# Patient Record
Sex: Male | Born: 1985 | Race: White | Hispanic: No | Marital: Single | Smoking: Current every day smoker
Health system: Southern US, Community
[De-identification: ages and names within clinical notes are randomized; demographics above are authoritative.]

## PROBLEM LIST (undated history)

## (undated) DIAGNOSIS — L0291 Cutaneous abscess, unspecified: Secondary | ICD-10-CM

---
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
## 2006-12-23 ENCOUNTER — Emergency Department: Payer: Self-pay | Admitting: Emergency Medicine

## 2007-03-29 ENCOUNTER — Emergency Department: Payer: Self-pay | Admitting: Emergency Medicine

## 2007-10-27 ENCOUNTER — Emergency Department: Payer: Self-pay | Admitting: Emergency Medicine

## 2008-03-12 ENCOUNTER — Emergency Department: Payer: Self-pay | Admitting: Emergency Medicine

## 2008-03-16 ENCOUNTER — Emergency Department: Payer: Self-pay | Admitting: Emergency Medicine

## 2008-03-18 ENCOUNTER — Emergency Department (HOSPITAL_COMMUNITY): Admission: EM | Admit: 2008-03-18 | Discharge: 2008-03-18 | Payer: Self-pay | Admitting: Emergency Medicine

## 2008-05-16 ENCOUNTER — Emergency Department (HOSPITAL_COMMUNITY): Admission: EM | Admit: 2008-05-16 | Discharge: 2008-05-16 | Payer: Self-pay | Admitting: Emergency Medicine

## 2008-06-08 ENCOUNTER — Emergency Department: Payer: Self-pay | Admitting: Emergency Medicine

## 2008-06-23 ENCOUNTER — Emergency Department (HOSPITAL_COMMUNITY): Admission: EM | Admit: 2008-06-23 | Discharge: 2008-06-24 | Payer: Self-pay | Admitting: Emergency Medicine

## 2008-10-09 ENCOUNTER — Emergency Department (HOSPITAL_COMMUNITY): Admission: EM | Admit: 2008-10-09 | Discharge: 2008-10-09 | Payer: Self-pay | Admitting: Emergency Medicine

## 2008-12-30 ENCOUNTER — Emergency Department (HOSPITAL_COMMUNITY): Admission: EM | Admit: 2008-12-30 | Discharge: 2008-12-30 | Payer: Self-pay | Admitting: Emergency Medicine

## 2009-08-29 ENCOUNTER — Emergency Department: Payer: Self-pay

## 2010-04-23 ENCOUNTER — Emergency Department (HOSPITAL_COMMUNITY): Admission: EM | Admit: 2010-04-23 | Discharge: 2010-04-24 | Payer: Self-pay | Admitting: Emergency Medicine

## 2010-04-26 ENCOUNTER — Emergency Department: Payer: Self-pay | Admitting: Emergency Medicine

## 2010-05-08 ENCOUNTER — Emergency Department (HOSPITAL_COMMUNITY): Admission: EM | Admit: 2010-05-08 | Discharge: 2010-05-08 | Payer: Self-pay | Admitting: Emergency Medicine

## 2010-07-26 ENCOUNTER — Emergency Department (HOSPITAL_COMMUNITY): Admission: EM | Admit: 2010-07-26 | Discharge: 2010-07-26 | Payer: Self-pay | Admitting: Emergency Medicine

## 2010-09-21 ENCOUNTER — Emergency Department (HOSPITAL_COMMUNITY): Admission: EM | Admit: 2010-09-21 | Discharge: 2010-05-28 | Payer: Self-pay | Admitting: Emergency Medicine

## 2010-09-25 ENCOUNTER — Emergency Department (HOSPITAL_COMMUNITY)
Admission: EM | Admit: 2010-09-25 | Discharge: 2010-09-25 | Payer: Self-pay | Source: Home / Self Care | Admitting: Emergency Medicine

## 2010-11-01 ENCOUNTER — Emergency Department: Payer: Self-pay | Admitting: Emergency Medicine

## 2010-11-02 ENCOUNTER — Emergency Department (HOSPITAL_COMMUNITY)
Admission: EM | Admit: 2010-11-02 | Discharge: 2010-11-02 | Payer: Self-pay | Source: Home / Self Care | Admitting: Emergency Medicine

## 2012-04-11 ENCOUNTER — Emergency Department (HOSPITAL_COMMUNITY)
Admission: EM | Admit: 2012-04-11 | Discharge: 2012-04-11 | Disposition: A | Discharge status: Home / Self Care | Payer: Self-pay | Attending: Emergency Medicine | Admitting: Emergency Medicine

## 2012-04-11 ENCOUNTER — Encounter (HOSPITAL_COMMUNITY): Payer: Self-pay | Admitting: *Deleted

## 2012-04-11 DIAGNOSIS — X503XXA Overexertion from repetitive movements, initial encounter: Secondary | ICD-10-CM | POA: Insufficient documentation

## 2012-04-11 DIAGNOSIS — K089 Disorder of teeth and supporting structures, unspecified: Secondary | ICD-10-CM | POA: Insufficient documentation

## 2012-04-11 DIAGNOSIS — M25569 Pain in unspecified knee: Secondary | ICD-10-CM | POA: Insufficient documentation

## 2012-04-11 DIAGNOSIS — M222X1 Patellofemoral disorders, right knee: Secondary | ICD-10-CM

## 2012-04-11 DIAGNOSIS — S025XXA Fracture of tooth (traumatic), initial encounter for closed fracture: Secondary | ICD-10-CM | POA: Insufficient documentation

## 2012-04-11 DIAGNOSIS — K0889 Other specified disorders of teeth and supporting structures: Secondary | ICD-10-CM

## 2012-04-11 DIAGNOSIS — K047 Periapical abscess without sinus: Secondary | ICD-10-CM | POA: Insufficient documentation

## 2012-04-11 MED ORDER — PENICILLIN V POTASSIUM 500 MG PO TABS: 1000.0000 mg | ORAL_TABLET | Freq: Two times a day (BID) | ORAL | Status: AC

## 2012-04-11 MED ORDER — OXYCODONE-ACETAMINOPHEN 5-325 MG PO TABS: 2.0000 | ORAL_TABLET | Freq: Three times a day (TID) | ORAL | Status: AC | PRN

## 2012-04-11 NOTE — ED Provider Notes (Signed)
History   This chart was scribed for No att. providers found by Toya Smothers. The patient was seen in room TR05C/TR05C. Patient's care was started at 1001.  CSN: [id number]  Arrival date & time 04/11/12  1001   First MD Initiated Contact with Patient 04/11/12 1115      Chief Complaint  Patient presents with  . Knee Pain    HPI Alex [name] is a 26 y.o. male who presents to the Emergency Department complaining of Chronic R knee pain localized to the patella tendon without radiation or associated weakness or numbness. Tooth number 29 fractured secondary to minor local trama. With scant puss drainage at time of injury with to filling. Pt denies HA or LOC at time of injury. No TMG no jaw malocclusion. No focal weakness or obscure coordination. Described both knee and dental pain as severe localized, with no associated symptoms.  History reviewed. No pertinent past medical history.  History reviewed. No pertinent past surgical history.  No family history on file.  History  Substance Use Topics  . Smoking status: Current Everyday Smoker    Types: Cigarettes  . Smokeless tobacco: Not on file  . Alcohol Use: No   Review of Systems  Constitutional: Negative for fever and chills.  HENT: Positive for dental problem (Fractured.). Negative for congestion, rhinorrhea and neck pain.   Eyes: Negative for pain.  Respiratory: Negative for cough and shortness of breath.   Cardiovascular: Negative for chest pain and leg swelling.  Gastrointestinal: Negative for nausea, vomiting, abdominal pain, diarrhea, constipation and blood in stool.  Genitourinary: Negative for dysuria.  Musculoskeletal: Positive for arthralgias (R knee). Negative for back pain and gait problem.  Skin: Negative for rash.  Neurological: Negative for dizziness, syncope, weakness, numbness and headaches.  All other systems reviewed and are negative.    Allergies  Doxycycline and Hydrocodone  Home Medications    Current Outpatient Rx  Name Route Sig Dispense Refill  . IBUPROFEN 800 MG PO TABS Oral Take 800 mg by mouth 2 (two) times daily as needed. For pain    . OXYCODONE-ACETAMINOPHEN 5-325 MG PO TABS Oral Take 2 tablets by mouth every 8 (eight) hours as needed for pain. 20 tablet 0  . PENICILLIN V POTASSIUM 500 MG PO TABS Oral Take 2 tablets (1,000 mg total) by mouth 2 (two) times daily. X 7 days 28 tablet 0    BP 121/89  Pulse 81  Temp 98 F (36.7 C) (Oral)  Resp 17  SpO2 98%  Physical Exam  Nursing note and vitals reviewed. Constitutional: He is oriented to person, place, and time. He appears well-developed and well-nourished. No distress.  HENT:  Head: Normocephalic and atraumatic.  Mouth/Throat:    Eyes: EOM are normal. Pupils are equal, round, and reactive to light.  Neck: Neck supple. No tracheal deviation present.  Cardiovascular: Normal rate and regular rhythm.   Pulmonary/Chest: Effort normal and breath sounds normal. No respiratory distress.  Abdominal: Soft. He exhibits no distension. There is no tenderness.  Musculoskeletal: Normal range of motion. He exhibits no edema.       Pt pulse intact L ankle. Normal touch R knee. Non-tender R thigh. Negative patella apprehension. tenderness localized R patella pendent . Papitatilla faucet non tender. No laxity or pain with lock test. Full active extension of R knee.  Lymphadenopathy:    He has no cervical adenopathy.  Neurological: He is alert and oriented to person, place, and time. He has normal reflexes. No cranial  nerve deficit or sensory deficit. Coordination normal.  Skin: Skin is warm and dry. No rash noted. No erythema. No pallor.  Psychiatric: He has a normal mood and affect. His behavior is normal.    ED Course  Procedures (including critical care time) DIAGNOSTIC STUDIES: Oxygen Saturation is 98% on room air, normal by my interpretation.    COORDINATION OF CARE: 1124- Evaluated Pt. Will treat with pain  medication. Advised not to take Ibuprofen and Aleve at the same time.   Labs Reviewed - No data to display No results found.   1. Patellofemoral syndrome, right   2. Pain, dental   3. Fractured tooth   4. Dental abscess       MDM  Pt stable in ED with no significant deterioration in condition.Patient / Family / Caregiver informed of clinical course, understand medical decision-making process, and agree with plan.     I personally performed the services described in this documentation, which was scribed in my presence. The recorded information has been reviewed and considered.    Hurman Horn, MD 04/20/12 [phone]

## 2012-04-11 NOTE — ED Notes (Signed)
Pt. Stated, I'm having rt. Knee pain and tooth ache and the knee hurts real bad.  Pain started month ago. Toothache, started "Tuesday

## 2012-04-11 NOTE — Discharge Instructions (Signed)
You have been seen by your caregiver because of dental pain.  SEEK MEDICAL ATTENTION IF: The exam and treatment you received today has been provided on an emergency basis only. This is not a substitute for complete medical or dental care. If your problem worsens or new symptoms (problems) appear, and you are unable to arrange prompt follow-up care with your dentist, call or return to this location. CALL YOUR DENTIST OR RETURN IMMEDIATELY IF you develop a fever, rash, difficulty breathing or swallowing, neck or facial swelling, or other potentially serious concerns. You have had a head injury which does not appear to require admission at this time. A concussion is a state of changed mental ability from trauma. SEEK IMMEDIATE MEDICAL ATTENTION IF: There is confusion or drowsiness (although children frequently become drowsy after injury).  You cannot awaken the injured person.  There is nausea (feeling sick to your stomach) or continued, forceful vomiting.  You notice dizziness or unsteadiness which is getting worse, or inability to walk.  You have convulsions or unconsciousness.  You experience severe, persistent headaches not relieved by Tylenol?. (Do not take aspirin as this impairs clotting abilities). Take other pain medications only as directed.  You cannot use arms or legs normally.  There are changes in pupil sizes. (This is the black center in the colored part of the eye)  There is clear or bloody discharge from the nose or ears.  Change in speech, vision, swallowing, or understanding.  Localized weakness, numbness, tingling, or change in bowel or bladder control.

## 2012-04-11 NOTE — ED Notes (Signed)
Patient to [facility] ED with C/O right knee pain. This has been ongoing for 2 months. States that he does a lot of stooping and standing at work.  Knee brace is in place.

## 2012-08-17 ENCOUNTER — Emergency Department: Payer: Self-pay | Admitting: Emergency Medicine

## 2013-06-17 ENCOUNTER — Emergency Department: Payer: Self-pay | Admitting: Emergency Medicine

## 2013-06-17 LAB — CBC
HCT: 46.7 % (ref 40.0–52.0)
HGB: 16.1 g/dL (ref 13.0–18.0)
MCHC: 34.5 g/dL (ref 32.0–36.0)
WBC: 9.9 10*3/uL (ref 3.8–10.6)

## 2013-06-17 LAB — URINALYSIS, COMPLETE
Bacteria: NONE SEEN
Ketone: NEGATIVE
Leukocyte Esterase: NEGATIVE
Squamous Epithelial: NONE SEEN
WBC UR: 4 /HPF (ref 0–5)

## 2013-06-17 LAB — COMPREHENSIVE METABOLIC PANEL
Anion Gap: 6 — ABNORMAL LOW (ref 7–16)
BUN: 27 mg/dL — ABNORMAL HIGH (ref 7–18)
Calcium, Total: 9.5 mg/dL (ref 8.5–10.1)
EGFR (African American): >60
Potassium: 4.3 mmol/L (ref 3.5–5.1)

## 2013-06-17 LAB — DRUG SCREEN, URINE
Amphetamines, Ur Screen: NEGATIVE (ref ?–1000)
Barbiturates, Ur Screen: NEGATIVE (ref ?–200)
Benzodiazepine, Ur Scrn: NEGATIVE (ref ?–200)
Cannabinoid 50 Ng, Ur ~~LOC~~: NEGATIVE (ref ?–50)
Opiate, Ur Screen: NEGATIVE (ref ?–300)
Phencyclidine (PCP) Ur S: NEGATIVE (ref ?–25)

## 2014-12-04 ENCOUNTER — Inpatient Hospital Stay: Payer: Self-pay | Admitting: Internal Medicine

## 2015-02-13 NOTE — Consult Note (Signed)
Chief Complaint:  Subjective/Chief Complaint Patient reports continued pain, but slightly improved.  mild drainage from I&D site.   Brief Assessment:  GEN well developed, well nourished   Additional Physical Exam Neck- improved fluctuance around submental area.  continued and expected tenderness and induration.  Skin- innumerable excoriations of upper and lower extremities and face   Assessment/Plan:  Assessment/Plan:  Assessment cellulitis and submental abcess s/p I&D   Plan 1)  Follow culture results but this was after Vancomycin and Cleocin so possibly only gram stain will be helpful 2)  Defer to medicine regarding discharge antibiotics 3)  Consider addition of Prednisone 60mg  oral taper for edema 4)  No further workup from ENT needed given drainage of only fluid site.   Electronic Signatures: Wilna Pennie, Rayfield Citizenreighton Charles (MD)  (Signed 19-Feb-16 17:46)  Authored: Chief Complaint, Brief Assessment, Assessment/Plan   Last Updated: 19-Feb-16 17:46 by Flossie DibbleVaught, Nicolette Gieske Charles (MD)

## 2015-02-13 NOTE — Discharge Summary (Signed)
PATIENT NAME:  Renato ShinMILLER, Tige D MR#:  [id number] DATE OF BIRTH:  10-29-1985  DATE OF ADMISSION:  12/04/2014  DATE OF DISCHARGE:  12/05/2014  DISCHARGE DIAGNOSES:  1.  Cellulitis of the face, MRSA in culture report. 2.  Cocaine abuse.  3.  Smoking.   CONDITION ON DISCHARGE: Stable.   MEDICATIONS ON DISCHARGE:  1.  Prednisone 10 mg oral tablet start 60 mg and taper x 10 mg daily until complete.  2.  Ibuprofen 600 mg oral tablet 2 times a day. 3.  Oxycodone 5 mg tablet every 6 hours as needed for moderate pain.  4.  Bactrim DS 800 mg +160 mg tablet 2 times a day for 12 days.   DIET ON DISCHARGE: Regular. Diet consistency: Regular.   ACTIVITY: As tolerated.   FOLLOW UP: Advised to follow with primary care physician in 2-4 weeks.   HISTORY OF PRESENT ILLNESS: The patient is a 29 year old male with a history of cocaine abuse presented to the Emergency Room with complaints of redness and draining skin lesion on the face and neck ongoing for the last 1 week. He develop a small boil over the face and further worsened as he tampered with that one. He had some fever, but no chills and gradually the swelling and discharge was getting bigger and bigger so he decided to come to the Emergency Room. There were numerous scratch marks and crusting skin lesions present on both his upper and lower extremities. He was admitted with a diagnosis of cellulitis and abscess.   HOSPITAL COURSE:  1.  Cellulitis and small abscess on the face. He was seen by ENT doctor and incision and drainage was done. This drainage was sent for culture report and admitted with broad-spectrum antibiotic vancomycin and Zosyn.  He had significant improvement in condition in the next 2 weeks and the culture reported to have MRSA in the report. I counseled him about good skin hygiene and discharged him with oral Bactrim to finish total of 2 weeks of therapy with antibiotics.   2.  Cocaine abuse. There was no symptoms of withdrawal. We  continued monitoring in the hospital.   3.  Smoking. Counseled about quitting smoking habit. He did not require any support but had episodes of walking out and smoking while he was admitted in the hospital.   CONSULTATION IN THE HOSPITAL:  ENT Dr. Bettey Costaharles Vaught.  IMPORTANT LABORATORY RESULTS IN THIS HOSPITAL: WBC count 11.3, hemoglobin was 14.7, platelet count 183,000, MCV 89 on admission. BUN was 13, creatinine 1.03, sodium 141, potassium 4.0, chloride 104, CO2 of 31 on admission. Blood culture on admission was negative. Ultrasound of the soft tissue of the head and neck shows subcutaneous abscess and ADL cellulitis, submental region. Wound culture grew heavy growth of MRSA.  On further follow-up WBC count came down to 8.5 on 12/04/2014 and hemoglobin stable at 14.1.   TOTAL TIME SPENT ON DISCHARGE: 40 minutes.    ____________________________ Hope PigeonVaibhavkumar G. Elisabeth PigeonVachhani, MD vgv:mc D: 12/08/2014 08:35:56 ET T: 12/08/2014 12:08:59 ET JOB#: [id number]  cc: Hope PigeonVaibhavkumar G. Elisabeth PigeonVachhani, MD, <Dictator> Altamese DillingVAIBHAVKUMAR Mavin Dyke MD ELECTRONICALLY SIGNED 12/27/2014 12:51

## 2015-02-13 NOTE — Consult Note (Signed)
PATIENT NAME:  Alex [name], Alex [name] MR#:  [id number] DATE OF BIRTH:  1986/08/19  DATE OF CONSULTATION:  12/03/2014  REFERRING PHYSICIAN:   CONSULTING PHYSICIAN:  Kyung Ruddreighton C. Romanita Fager, MD  REASON FOR CONSULTATION: Submental abscess.   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 He has had a history of facial abscess before in the past, who has presented to the Emergency Room and was found to have a submental abscess on ultrasound examination. Admitted, placed on vancomycin and clindamycin. Blood cultures were obtained. The patient also has a history of illicit drug use, as well as tobacco use. The patient reports he had this several years ago on his face and [facility]   PAST MEDICAL HISTORY: Significant for illicit drug use.   PAST SURGICAL HISTORY:  An incision and drainage of facial abscess.   ALLERGIES: HYDROCODONE, NAUSEA.   HOME MEDICATIONS: None.  FAMILY HISTORY: There is no significant history of diabetes or hypertension.   SOCIAL HISTORY: The patient is engaged. Works as a Curatormechanic. He has a history of cocaine use and tobacco use. Denies any IV drug use.   PHYSICAL EXAMINATION: VITAL SIGNS: Temperature is 99.1, pulse is 80 respirations 20, and blood pressure 122/84, pulse oximetry 99%.  GENERAL: He is a well-nourished, well-developed male in no acute distress.  EARS: EACs are clear.  NOSE: Clear anteriorly. Oral cavity and oropharynx reveals no masses or lesions. Floor of mouth is supple to palpation. NECK: Reveals diffuse erythema and tenderness in the submental and mental region, extending up to the angle of his mandible. There is some fluctuance to palpation in his submental region, as well as a fullness just more posterior to that.  SKIN: He has innumerable excoriations from self-inflicted injuries with "picking" at his skin, per patient.   PROCEDURE: Incision and drainage  was performed. Please see separate operative note for details.   IMAGING STUDIES: Ultrasound report is reviewed, which reveals a fluid collection of the submental region.   IMPRESSION: Submental abscess and cellulitis, status post incision and drainage.   PLAN: I will send a culture out for aerobic wound culture, but this was after the Cleocin and vancomycin had already been given. Consider addition of oral prednisone. I would anticipate resolution of the submental abscess once appropriate antibiotics are continued. I will defer to medicine for a decision as far as what that will be, based on the culture results. I will reevaluate the patient later on today and if continued improvement, [facility]     ____________________________ Kyung Ruddreighton C. Loel Betancur, MD ccv:mw [name]: 12/03/2014 17:50:10 ET T: 12/03/2014 18:42:02 ET JOB#: [id number]  cc: Kyung Ruddreighton C. Aidian Salomon, MD, <Dictator> Kyung RuddREIGHTON C Ciarah Peace MD ELECTRONICALLY SIGNED 01/05/2015 9:50

## 2015-02-13 NOTE — Op Note (Signed)
PATIENT NAME:  Alex [name], Alex [name] MR#:  [id number] DATE OF BIRTH:  August 21, 1986  DATE OF PROCEDURE:  12/03/2014  PREPROCEDURE DIAGNOSIS: Submental abscess.   POSTPROCEDURE DIAGNOSIS: Submental abscess.   PROCEDURE PERFORMED: Incision and drainage of submental abscess.   SURGEON: Local anesthesia.   ESTIMATED BLOOD LOSS: Less than 1 mL.  INTRAVENOUS FLUIDS: None.   SPECIMENS: Swab for aerobic culture.   COMPLICATIONS: None.   DRAINS, STENT PLACEMENTS: None.   INDICATIONS FOR PROCEDURE: The patient is a [age] year old male with a history of cellulitis, found to have submental abscess on ultrasound.   OPERATIVE FINDINGS: Purulent and necrotic drainage from the patient's submental abscess with successful incision and drainage.   DESCRIPTION OF PROCEDURE: The patient was identified in his bed. Verbal and written consent was obtained. The patient's neck was prepped and draped in a sterile fashion with iodine. A 25-gauge needle was in used for injection of the submental region of approximately 2.5 mL of 1% lidocaine with 1:100,000 epinephrine. This demonstrated a small drain pore. Using a hemostat, this pore was opened. Septations within the submental abscess were also opened and approximately 10 mL of purulent drainage was expressed from the submental region. The patient tolerated the procedure well and this was dressed with 4 x 4 gauze.    ____________________________ Kyung Ruddreighton C. Tynesha Free, MD ccv:bm [name]: 12/03/2014 17:52:29 ET T: 12/03/2014 23:57:13 ET JOB#: [id number]  cc: Kyung Ruddreighton C. Zacheriah Stumpe, MD, <Dictator> Kyung RuddREIGHTON C Dola Lunsford MD ELECTRONICALLY SIGNED 01/05/2015 9:50

## 2015-02-13 NOTE — Consult Note (Signed)
Brief Consult Note: Diagnosis: abcess of submental area.   Consult note dictated.   Comments: 29 y.o. male with submental fluid collection and pain.  Ultrasound of submental region demonstrated abcess.  PE- Gen- multiple sores over upper and lower extremities and face OC/OP- supple FOM with no induration Neck- induration and fluctuance in submental region, purulent drainage Ears- clear Nose- clear  I&D performed of submental abcess- fluid collection drained and swab sent for culture  Impression:  s/p I & D of abcess  Plan:  1)  culture directed antibiotics if any viable bacteria there 2)  continue dressings prn drainage 3)  Defer Abx to medicine.  Electronic Signatures: Sparkle Aube, Rayfield Citizenreighton Charles (MD)  (Signed 19-Feb-16 13:32)  Authored: Brief Consult Note   Last Updated: 19-Feb-16 13:32 by Flossie DibbleVaught, Romana Deaton Charles (MD)

## 2015-02-13 NOTE — H&P (Signed)
PATIENT NAME:  Alex [name], HARR MR#:  [id number] DATE OF BIRTH:  [birth year]  DATE OF ADMISSION:  12/03/2014  REFERRING PHYSICIAN:  Enedina Finner. Manson Passey, MD   PRIMARY CARE PRACTITIONER:  Nonlocal.   ADMITTING PHYSICIAN:  Crissie Figures, MD   CHIEF COMPLAINT:  Redness with discharging skin lesions from the face and neck of 1 week duration.   HISTORY OF PRESENT ILLNESS:  This is a 29 year old Caucasian male with a history of cocaine usage, who presents to the Emergency Room with the complaints of redness with draining skin lesions over the face and neck ongoing for the past 1 week. The patient states that he developed small boils over the face, which further worsened and progressed into discharging and draining wounds. Hence, he came to the Emergency Room for further evaluation. He admits to some fever, but no chills. No chest pain. No shortness of breath. No nausea, vomiting, or diarrhea. He does have some cough, but no sputum. No urinary symptoms.   In the Emergency Room, the patient was evaluated by the ED physician and was found to be with stable vitals and workup was unremarkable. A urine drug screen was positive for cocaine and opiates. The patient was diagnosed to have cellulitis of the face and anterior part of the neck, and the patient also underwent ultrasound examination of the face and the neck, which revealed a 1 cm subcutaneous abscess in the cellulitic region. General surgery on-call was consulted by the ED physician and general surgery recommended to contact ENT for further evaluation. Hospitalist service was consulted for further evaluation and management. The patient had blood cultures drawn and was started on IV antibiotics.   PAST MEDICAL HISTORY:  Cocaine usage. No history of any diabetes or hypertension.  PAST SURGICAL HISTORY:  No history of any surgeries in the past.   ALLERGIES:  HYDROCODONE.   HOME MEDICATIONS:  None.   FAMILY HISTORY:  No history of any hypertension or  diabetes.   SOCIAL HISTORY:  He is engaged. He works as a Curator on motorcycles. History of smoking 1 pack per day for the past many years. History of cocaine usage and states he snorts cocaine. Denies any IV drug usage. Denies any alcohol usage.    REVIEW OF SYSTEMS:  CONSTITUTIONAL:  Positive for fever, but no chills. No fatigue. No generalized weakness.  EYES:  Negative for blurred vision or double vision. No pain. No redness. No discharge.  EARS, NOSE, AND THROAT:  Negative for tinnitus, ear pain, hearing loss, epistaxis, nasal discharge, or difficulty swallowing.  RESPIRATORY:  Positive for some mild cough. No wheezing. No dyspnea. No hemoptysis. No painful respirations.  CARDIOVASCULAR:  Negative for chest pain, palpitations, dizziness, syncopal episodes, orthopnea, dyspnea on exertion, or pedal edema.  GASTROINTESTINAL:  Negative for nausea, vomiting, diarrhea, abdominal pain, hematemesis, melena, rectal bleeding, or GERD symptoms.  GENITOURINARY:  Negative for dysuria, frequency, urgency, or hematuria.  ENDOCRINE:  Negative for polyuria, nocturia, or heat or cold intolerance.  HEMATOLOGIC AND LYMPHATIC:  Negative for anemia, easy bruising or bleeding, or swollen glands.  INTEGUMENTARY:  Positive for multiple skin lesions over the face and anterior part of the neck with surrounding erythema and localized edema present. Two discharging wounds present on the face on the chin.  MUSCULOSKELETAL:  Negative for back pain or neck pain. No history of arthritis or gout.  NEUROLOGICAL:  Negative for focal weakness or numbness. No history of CVA, TIA, or seizure disorder.  PSYCHIATRIC:  Negative for anxiety,  insomnia, or depression.   PHYSICAL EXAMINATION: VITAL SIGNS:  Temperature 98.9 degrees Fahrenheit, pulse rate 98, respirations 18 per minute, blood pressure 119/70, oxygen saturation 100% on room air.  GENERAL:  Well developed, well nourished, alert, in no acute distress.  HEAD:  Atraumatic,  normocephalic.  FACE:  Extensive cellulitis in the lower part of the face with 2 discharging wounds with purulent discharge present.  EYES:  Pupils are equal and react to light and accommodation. No conjunctival pallor. No icterus. Extraocular movements are intact.  NOSE:  No drainage. No lesions.  EARS:  No drainage. No external lesions.  ORAL CAVITY:  No mucosal lesions. No exudates.  NECK:  Supple. No JVD. No thyromegaly. No carotid bruit. Range of motion of the neck is within normal limits. Erythema present on anterior part of the neck.  RESPIRATORY:  Good respiratory effort. Not using accessory muscles of respiration. Bilateral vesicular breath sounds. No rales or rhonchi.  CARDIOVASCULAR:  S1 and S2, regular. No murmurs, gallops, or clicks. Pulses are equal at carotid, femoral, and pedal pulses. No peripheral edema.  GASTROINTESTINAL:  Abdomen is soft and nontender. No hepatosplenomegaly. No masses. No rigidity. No guarding. Bowel sounds are present and equal in all 4 quadrants.  GENITOURINARY:  Deferred. MUSCULOSKELETAL:  No joint tenderness. Range of motion is adequate. Strength and tone are equal bilaterally.  SKIN:  Extensive erythema with localized edema on the lower part of the anterior face and the anterior part of the neck present. Two discharging wounds with purulent discharge present.  LYMPHATIC:  No cervical lymphadenopathy.  VASCULAR:  Poor dorsalis pedis and posterior tibial pulses.  NEUROLOGICAL:  Alert, awake, and oriented x 3. Cranial nerves II through XII are grossly intact. No sensory deficit. Motor strength is 5/5 bilaterally. DTRs are 2+ bilaterally and symmetrical. Plantars are downgoing.  PSYCHIATRIC:  Alert, awake, and oriented x 3. Judgment and insight are adequate. Memory and mood are within normal limits.   ANCILLARY DATA:   LABORATORY DATA:  Serum glucose is 102, BUN 13, creatinine 1.03, sodium 141, potassium 4.0, chloride 104, bicarbonate 31, calcium 9.3, total  protein 7.9, albumin 4.2, total bilirubin 0.7, alkaline phosphatase 76, AST 51, and ALT 99. Urine drug screen is positive for cocaine and opiates. WBC is 11.3, hemoglobin 14.7, hematocrit 44.4, and platelet count 183,000.   IMAGING STUDIES:  Ultrasound of the soft tissues:  Subcutaneous abscess in area of cellulitis in the submental region and 1.1 x 0.5 x 2.6 cm complex collection in the subcutaneous tissues of the submental region, likely an abscess.   ASSESSMENT AND PLAN:  This is a 29 year old Caucasian male with a history of cocaine usage, who presents with skin lesions with purulent discharge from the  face/neck and also found to have urine drug screen positive for cocaine and opiates.   1.  Cellulitis/small abscesses on the face and the neck. Plan:  Admit to medical floor. Blood cultures obtained. IV antibiotics of vancomycin and clindamycin. Pain control medications. ENT consultation for any surgical need.  2.  Cocaine usage. No symptoms. Counseled about drug abuse.  3.  Tobacco usage. Counseled to quit. The patient is not motivated to quit.  4.  Deep vein thrombosis prophylaxis with subcutaneous Lovenox.  5.  Gastrointestinal prophylaxis with ranitidine.   CODE STATUS:  Full code.   TIME SPENT:  40 minutes.    ____________________________ Crissie Figures, MD enr:nb D: 12/03/2014 06:12:25 ET T: 12/03/2014 06:39:23 ET JOB#: [id number]  cc: Crissie Figures, MD, <  Dictator> Crissie FiguresEDAVALLY N Reade Trefz MD ELECTRONICALLY SIGNED 12/03/2014 18:58

## 2015-03-17 ENCOUNTER — Encounter (HOSPITAL_COMMUNITY): Payer: Self-pay | Admitting: *Deleted

## 2015-03-17 ENCOUNTER — Emergency Department (HOSPITAL_COMMUNITY)
Admission: EM | Admit: 2015-03-17 | Discharge: 2015-03-17 | Disposition: A | Discharge status: Home / Self Care | Payer: Self-pay | Attending: Emergency Medicine | Admitting: Emergency Medicine

## 2015-03-17 DIAGNOSIS — L98499 Non-pressure chronic ulcer of skin of other sites with unspecified severity: Secondary | ICD-10-CM | POA: Insufficient documentation

## 2015-03-17 DIAGNOSIS — Z720 Tobacco use: Secondary | ICD-10-CM | POA: Insufficient documentation

## 2015-03-17 DIAGNOSIS — L0201 Cutaneous abscess of face: Secondary | ICD-10-CM | POA: Insufficient documentation

## 2015-03-17 MED ORDER — NAPROXEN 500 MG PO TABS: 500.0000 mg | ORAL_TABLET | Freq: Two times a day (BID) | ORAL | Status: AC

## 2015-03-17 MED ORDER — CLINDAMYCIN HCL 150 MG PO CAPS: 150.0000 mg | ORAL_CAPSULE | Freq: Four times a day (QID) | ORAL | Status: DC

## 2015-03-17 MED ORDER — LIDOCAINE-EPINEPHRINE (PF) 2 %-1:200000 IJ SOLN
10.0000 mL | Freq: Once | INTRAMUSCULAR | Status: AC
  Administered 2015-03-17: 10 mL via INTRADERMAL
  Filled 2015-03-17: qty 20

## 2015-03-17 NOTE — ED Provider Notes (Signed)
CSN: [id number]     Arrival date & time 03/17/15  1919 History  This chart was scribed for non-physician practitioner Fayrene HelperBowie Michele Kerlin, PA-C working with Arby BarretteMarcy Pfeiffer, MD by Lyndel SafeKaitlyn Shelton, ED Scribe. This patient was seen in room TR05C/TR05C and the patient's care was started at 8:25 PM.  Chief Complaint  Patient presents with  . Abscess   The history is provided by the patient. No language interpreter was used.    HPI Comments: Alex [name] is a 29 y.o. male, with a PMhx of abscesses and IV drug abuse, who presents to the Emergency Department complaining of a gradually worsening area of pain and swelling on his right mandible region onset 1 month ago with associated drainage. Pt has been prescribed Bactrim for pervious abscesses, which he has taken 10 of with no relief. He has also tried warm compresses and ibuprofen with no relief. Pt currently smokes 1 ppd. He notes IV drug abuse in the past but denies any currently. UTD on tetanus shot. He denies fevers, chills, dysphagia, or dental pain.   History reviewed. No pertinent past medical history. History reviewed. No pertinent past surgical history. No family history on file. History  Substance Use Topics  . Smoking status: Current Every Day Smoker -- 1.00 packs/day    Types: Cigarettes  . Smokeless tobacco: Not on file  . Alcohol Use: No    Review of Systems  Constitutional: Negative for fever and chills.  HENT: Positive for facial swelling. Negative for dental problem and trouble swallowing.   Skin: Positive for wound ( abscess right mandible ).   Allergies  Doxycycline and Hydrocodone  Home Medications   Prior to Admission medications   Medication Sig Start Date End Date Taking? Authorizing Provider  ibuprofen (ADVIL,MOTRIN) 800 MG tablet Take 800 mg by mouth 2 (two) times daily as needed. For pain    Historical Provider, MD   BP 111/65 mmHg  Pulse 88  Temp(Src) 98.1 F (36.7 C) (Oral)  Resp 18  SpO2 96%  Physical Exam   Constitutional: He appears well-developed and well-nourished.  HENT:  Head: Normocephalic and atraumatic.  Area of induration noted along the right jaw line that is TTP.  No surrounding erythema.  No dental abscesses.  Eyes: Conjunctivae are normal. Right eye exhibits no discharge. Left eye exhibits no discharge.  Pulmonary/Chest: Effort normal. No respiratory distress.  Neurological: He is alert. Coordination normal.  Skin: Skin is warm and dry. No rash noted. He is not diaphoretic. No erythema.  Multiple ulceration to dorsum of hand bilaterally.  No obvious signs of infection.   Psychiatric: He has a normal mood and affect.  Nursing note and vitals reviewed.   ED Course  Procedures  DIAGNOSTIC STUDIES: Oxygen Saturation is 96% on RA, adequate by my interpretation.    COORDINATION OF CARE: 8:28 PM Discussed treatment plan which includes getting an ultrasound of the area to make sure it is able to be drained and performing an I&D procedure with pt. Pt acknowledges and agrees to plan.   8:37 PM INCISION AND DRAINAGE PROCEDURE NOTE: Patient identification was confirmed and verbal consent was obtained. This procedure was performed by Fayrene HelperBowie Aidah Forquer, PA-C at 8:37 PM. Site: face, right jaw line Sterile procedures observed Needle size: 25 gauge Anesthetic used (type and amt): 2% lido w epi, 1ml Blade size: 11 Drainage: minimal Complexity: Complex Packing used: none Site anesthetized, incision made over site, wound drained and explored loculations, rinsed with copious amounts of normal saline, wound  packed with sterile gauze, covered with dry, sterile dressing.  Pt tolerated procedure well without complications.  Instructions for care discussed verbally and pt provided with additional written instructions for homecare and f/u.   Labs Review Labs Reviewed - No data to display  Imaging Review No results found.   EKG Interpretation None      MDM   Final diagnoses:   Cutaneous abscess of face    BP 111/65 mmHg  Pulse 88  Temp(Src) 98.1 F (36.7 C) (Oral)  Resp 18  SpO2 96%   I personally performed the services described in this documentation, which was scribed in my presence. The recorded information has been reviewed and is accurate.     Fayrene Helper, PA-C 03/17/15 2055  Fayrene Helper, PA-C 03/17/15 1610  Arby Barrette, MD 03/17/15 [phone]

## 2015-03-17 NOTE — ED Notes (Signed)
Pt c/o facial abscess x 1 month.

## 2015-03-17 NOTE — Discharge Instructions (Signed)

## 2015-05-21 ENCOUNTER — Encounter (HOSPITAL_COMMUNITY): Payer: Self-pay

## 2015-05-21 ENCOUNTER — Emergency Department (HOSPITAL_COMMUNITY)
Admission: EM | Admit: 2015-05-21 | Discharge: 2015-05-21 | Disposition: A | Discharge status: Home / Self Care | Payer: Self-pay | Attending: Emergency Medicine | Admitting: Emergency Medicine

## 2015-05-21 DIAGNOSIS — Z792 Long term (current) use of antibiotics: Secondary | ICD-10-CM | POA: Insufficient documentation

## 2015-05-21 DIAGNOSIS — L02419 Cutaneous abscess of limb, unspecified: Secondary | ICD-10-CM

## 2015-05-21 DIAGNOSIS — Z791 Long term (current) use of non-steroidal anti-inflammatories (NSAID): Secondary | ICD-10-CM | POA: Insufficient documentation

## 2015-05-21 DIAGNOSIS — L02414 Cutaneous abscess of left upper limb: Secondary | ICD-10-CM | POA: Insufficient documentation

## 2015-05-21 DIAGNOSIS — Z720 Tobacco use: Secondary | ICD-10-CM | POA: Insufficient documentation

## 2015-05-21 DIAGNOSIS — L0201 Cutaneous abscess of face: Secondary | ICD-10-CM | POA: Insufficient documentation

## 2015-05-21 HISTORY — DX: Cutaneous abscess, unspecified: L02.91

## 2015-05-21 MED ORDER — CLINDAMYCIN HCL 150 MG PO CAPS: 150.0000 mg | ORAL_CAPSULE | Freq: Four times a day (QID) | ORAL | Status: DC

## 2015-05-21 MED ORDER — CLINDAMYCIN HCL 300 MG PO CAPS
300.0000 mg | ORAL_CAPSULE | Freq: Once | ORAL | Status: AC
  Administered 2015-05-21: 300 mg via ORAL
  Filled 2015-05-21: qty 2
  Filled 2015-05-21: qty 1

## 2015-05-21 MED ORDER — LIDOCAINE-EPINEPHRINE (PF) 2 %-1:200000 IJ SOLN
20.0000 mL | Freq: Once | INTRAMUSCULAR | Status: AC
  Administered 2015-05-21: 20 mL via INTRADERMAL
  Filled 2015-05-21: qty 20

## 2015-05-21 NOTE — ED Provider Notes (Signed)
CSN: [id number]     Arrival date & time 05/21/15  0214 History  This chart was scribed for Blane Ohara, MD by Freida Busman, ED Scribe. This patient was seen in room B19C/B19C and the patient's care was started 2:44 AM.    Chief Complaint  Patient presents with  . Abscess    The history is provided by the patient. No language interpreter was used.     HPI Comments:  Alex [name] is a 29 y.o. male who presents to the Emergency Department complaining of an "abscess" to his right chin which he noticed yesterday. He states he was seen ~1 month ago for the same. He had the area drained but notes it has returned. Pt reports another abscess to his left forearm for ~3 days. He reports associated chills. No recent fever; no alleviating factors noted. 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    Past Medical History  Diagnosis Date  . Abscess    History reviewed. No pertinent past surgical history. No family history on file. History  Substance Use Topics  . Smoking status: Current Every Day Smoker -- 1.00 packs/day    Types: Cigarettes  . Smokeless tobacco: Not on file  . Alcohol Use: No    Review of Systems  Constitutional: Positive for chills. Negative for fever.  Skin: Positive for rash.  All other systems reviewed and are negative.     Allergies  Doxycycline and Hydrocodone  Home Medications   Prior to Admission medications   Medication Sig Start Date End Date Taking? Authorizing Provider  clindamycin (CLEOCIN) 150 MG capsule Take 1 capsule (150 mg total) by mouth every 6 (six) hours. 03/17/15   Fayrene Helper, PA-C  clindamycin (CLEOCIN) 150 MG capsule Take 1 capsule (150 mg total) by mouth every 6 (six) hours. 05/21/15   Blane Ohara, MD  ibuprofen (ADVIL,MOTRIN) 800 MG tablet Take 800 mg by mouth 2 (two) times daily as needed. For pain    Historical Provider, MD  naproxen (NAPROSYN) 500 MG tablet Take 1 tablet (500 mg  total) by mouth 2 (two) times daily. 03/17/15   Fayrene Helper, PA-C   BP 110/64 mmHg  Pulse 63  Temp(Src) 97.9 F (36.6 C) (Oral)  Resp 16  Ht 6' (1.829 m)  Wt 180 lb (81.647 kg)  BMI 24.41 kg/m2  SpO2 100% Physical Exam  Constitutional: He is oriented to person, place, and time. He appears well-developed and well-nourished. No distress.  HENT:  Head: Normocephalic and atraumatic.  Mouth/Throat: Oropharynx is clear and moist.  No trismus; No significant  swelling   Eyes: Conjunctivae are normal. Pupils are equal, round, and reactive to light.  Cardiovascular: Normal rate, regular rhythm and normal heart sounds.   Pulmonary/Chest: Effort normal.  Abdominal: Soft. He exhibits no distension. There is no tenderness.  Neurological: He is alert and oriented to person, place, and time.  Skin: Skin is warm and dry.  Mild swelling to right anterior lower jaw without surrounding erythema; Mild TTP; Mild fluctuance with induration  Left medial arm ~3 cm in diameter area of induration warmth and fluctuance; Good pulses distally; Soft compartments distally    Psychiatric: He has a normal mood and affect.  Nursing note and vitals reviewed.   ED Course  Procedures  EMERGENCY DEPARTMENT US SOFT TISSUE INTERPRETATION "Study: Limited Soft Tissue Ultrasound"  INDICATIONS: Pain and Soft tissue infection Multiple views of the body part were  obtained in real-time with a multi-frequency linear probe PERFORMED BY:  Myself IMAGES ARCHIVED?: Yes SIDE:Left BODY PART:Upper extremity FINDINGS: Abcess present and Cellulitis present INTERPRETATION:  Abcess present and Cellulitis present   CPT: Neck 76536-26  Upper extremity 76880-26  Axilla 16109-60  Chest wall 45409-81  Beast 19147-82  Upper back 95621-30  Lower back 86578-46  Abdominal wall 96295-28  Pelvic wall 41324-40  Lower extremity 10272-53  Other soft tissue 66440-34  EMERGENCY DEPARTMENT US SOFT TISSUE  INTERPRETATION "Study: Limited Soft Tissue Ultrasound"  INDICATIONS: Pain and Soft tissue infection Multiple views of the body part were obtained in real-time with a multi-frequency linear probe PERFORMED BY:  Myself IMAGES ARCHIVED?: Yes SIDE:Right  BODY PART:Other soft tisse (comment in note) FINDINGS: Abcess present and Cellulitis absent INTERPRETATION:  Abcess present and No cellulitis noted   CPT: Neck 76536-26  Upper extremity 76880-26  Axilla 74259-56  Chest wall 38756-43  Beast 32951-88  Upper back 41660-63  Lower back 01601-09  Abdominal wall 32355-73  Pelvic wall 22025-42  Lower extremity 70623-76  Other soft tissue 28315-17   DIAGNOSTIC STUDIES:  Oxygen Saturation is 94% on RA, adequate by my interpretation.    COORDINATION OF CARE:  2:48 AM US shows fluid cellulitis and abscess to left arm   2:50 AM Will incise and drain both sites. Discussed treatment plan with pt at bedside and pt agreed to plan.  INCISION AND DRAINAGE PROCEDURE NOTE: Patient identification was confirmed and verbal consent was obtained. This procedure was performed by Blane Ohara, MD at 2:58 AM. Site: right anterior lower jaw  Sterile procedures observed Needle size: 11 blade Anesthetic used (type and amt): lido w epi Blade size: 11 blade Drainage: mild purulence Complexity: Complex Packing usednone Site anesthetized, incision made over site, wound drained and explored loculations, rinsed with copious amounts of normal saline, wound packed with sterile gauze, covered with dry, sterile dressing.  Pt tolerated procedure well without complications.  Instructions for care discussed verbally and pt provided with additional written instructions for homecare and f/u.  INCISION AND DRAINAGE PROCEDURE NOTE: Patient identification was confirmed and verbal consent was obtained. This procedure was performed by Blane Ohara, MD at 2:58 AM. Site: Left medial arm Sterile  procedures observed Needle size: 25 g Anesthetic used (type and amt): lido epi Blade size: 11 Drainage:  Mild purulent Complexity: Complex Packing usednone Site anesthetized, incision made over site, wound drained and explored loculations, rinsed with copious amounts of normal saline, wound packed with sterile gauze, covered with dry, sterile dressing.  Pt tolerated procedure well without complications.  Instructions for care discussed verbally and pt provided with additional written instructions for homecare and f/u.   Labs Review Labs Reviewed - No data to display  Imaging Review No results found.   EKG Interpretation None      MDM   Final diagnoses:  Arm abscess  Abscess of face   Patient with IV drug abuse history presents with 2 abscesses 1 clinically infected. Ultrasound confirmed details. Oral anabolic skin the ER nontoxic appearing, no fever. Plan for incision and drainage and close outpatient follow-up.  Results and differential diagnosis were discussed with the patient/parent/guardian. Xrays were independently reviewed by myself.  Close follow up outpatient was discussed, comfortable with the plan.   Medications  lidocaine-EPINEPHrine (XYLOCAINE W/EPI) 2 %-1:200000 (PF) injection 20 mL (20 mLs Intradermal Given 05/21/15 0315)  clindamycin (CLEOCIN) capsule 300 mg (300 mg Oral Given 05/21/15 0322)    Filed Vitals:   05/21/15 0245  05/21/15 0300 05/21/15 0315 05/21/15 0448  BP: 114/77 103/64 101/61 110/64  Pulse: 66 57 59 63  Temp:    97.9 F (36.6 C)  TempSrc:    Oral  Resp:  16  16  Height:      Weight:      SpO2: 99% 100% 100% 100%    Final diagnoses:  Arm abscess  Abscess of face      Blane Ohara, MD 05/21/15 [phone]

## 2015-05-21 NOTE — Discharge Instructions (Signed)
If you were given medicines take as directed.  If you are on coumadin or contraceptives realize their levels and effectiveness is altered by many different medicines.  If you have any reaction (rash, tongues swelling, other) to the medicines stop taking and see a physician.    If your blood pressure was elevated in the ER make sure you follow up for management with a primary doctor or return for chest pain, shortness of breath or stroke symptoms.  Please follow up as directed and return to the ER or see a physician for new or worsening symptoms.  Thank you. Filed Vitals:   05/21/15 0219  BP: 107/66  Pulse: 61  Temp: 97.7 F (36.5 C)  TempSrc: Oral  Resp: 16  Height: 6' (1.829 m)  Weight: 180 lb (81.647 kg)  SpO2: 94%    Abscess An abscess (boil or furuncle) is an infected area on or under the skin. This area is filled with yellowish-white fluid (pus) and other material (debris). HOME CARE   Only take medicines as told by your doctor.  If you were given antibiotic medicine, take it as directed. Finish the medicine even if you start to feel better.  If gauze is used, follow your doctor's directions for changing the gauze.  To avoid spreading the infection:  Keep your abscess covered with a bandage.  Wash your hands well.  Do not share personal care items, towels, or whirlpools with others.  Avoid skin contact with others.  Keep your skin and clothes clean around the abscess.  Keep all doctor visits as told. GET HELP RIGHT AWAY IF:   You have more pain, puffiness (swelling), or redness in the wound site.  You have more fluid or blood coming from the wound site.  You have muscle aches, chills, or you feel sick.  You have a fever. MAKE SURE YOU:   Understand these instructions.  Will watch your condition.  Will get help right away if you are not doing well or get worse. Document Released: 03/19/2008 Document Revised: 04/01/2012 Document Reviewed:  12/14/2011 [facility] Patient Information 2015 [place], [place]. This information is not intended to replace advice given to you by your health care provider. Make sure you discuss any questions you have with your health care provider.

## 2015-05-21 NOTE — ED Notes (Signed)
Pt stable, ambulatory, states understanding of discharge instructions 

## 2015-05-21 NOTE — ED Notes (Signed)
Pt reports abscess to right side of chin and to left antecubital area. Chin abscess was drained here a month ago but has since returned and the left AC abscess he noticed 3 days ago. Denies N/V/D.

## 2015-07-28 ENCOUNTER — Emergency Department
Admission: EM | Admit: 2015-07-28 | Discharge: 2015-07-28 | Discharge status: Against Medical Advice | Payer: Self-pay | Attending: Emergency Medicine | Admitting: Emergency Medicine

## 2015-07-28 ENCOUNTER — Encounter: Payer: Self-pay | Admitting: Emergency Medicine

## 2015-07-28 DIAGNOSIS — Z720 Tobacco use: Secondary | ICD-10-CM | POA: Insufficient documentation

## 2015-07-28 DIAGNOSIS — K0889 Other specified disorders of teeth and supporting structures: Secondary | ICD-10-CM | POA: Insufficient documentation

## 2015-07-28 NOTE — ED Notes (Signed)
Patient ambulatory to triage with steady gait, without difficulty or distress noted; pt reports right sided dental pain and abscess to right jaw

## 2015-12-24 ENCOUNTER — Encounter (HOSPITAL_COMMUNITY): Payer: Self-pay

## 2015-12-24 ENCOUNTER — Emergency Department (HOSPITAL_COMMUNITY)
Admission: EM | Admit: 2015-12-24 | Discharge: 2015-12-24 | Disposition: A | Discharge status: Home / Self Care | Payer: Self-pay | Attending: Physician Assistant | Admitting: Physician Assistant

## 2015-12-24 DIAGNOSIS — L0201 Cutaneous abscess of face: Secondary | ICD-10-CM | POA: Insufficient documentation

## 2015-12-24 DIAGNOSIS — Z792 Long term (current) use of antibiotics: Secondary | ICD-10-CM | POA: Insufficient documentation

## 2015-12-24 DIAGNOSIS — Z791 Long term (current) use of non-steroidal anti-inflammatories (NSAID): Secondary | ICD-10-CM | POA: Insufficient documentation

## 2015-12-24 DIAGNOSIS — F17210 Nicotine dependence, cigarettes, uncomplicated: Secondary | ICD-10-CM | POA: Insufficient documentation

## 2015-12-24 DIAGNOSIS — L0291 Cutaneous abscess, unspecified: Secondary | ICD-10-CM

## 2015-12-24 MED ORDER — LIDOCAINE-EPINEPHRINE (PF) 2 %-1:200000 IJ SOLN
10.0000 mL | Freq: Once | INTRAMUSCULAR | Status: AC
  Administered 2015-12-24: 10 mL
  Filled 2015-12-24: qty 20

## 2015-12-24 MED ORDER — SULFAMETHOXAZOLE-TRIMETHOPRIM 800-160 MG PO TABS: 1.0000 | ORAL_TABLET | Freq: Two times a day (BID) | ORAL | Status: AC

## 2015-12-24 MED ORDER — OXYCODONE-ACETAMINOPHEN 5-325 MG PO TABS: 2.0000 | ORAL_TABLET | ORAL | Status: AC | PRN

## 2015-12-24 MED ORDER — OXYCODONE-ACETAMINOPHEN 5-325 MG PO TABS
1.0000 | ORAL_TABLET | Freq: Once | ORAL | Status: AC
  Administered 2015-12-24: 1 via ORAL
  Filled 2015-12-24: qty 1

## 2015-12-24 NOTE — Discharge Instructions (Signed)
Abscess °An abscess is an infected area that contains a collection of pus and debris. It can occur in almost any part of the body. An abscess is also known as a furuncle or boil. °CAUSES  °An abscess occurs when tissue gets infected. This can occur from blockage of oil or sweat glands, infection of hair follicles, or a minor injury to the skin. As the body tries to fight the infection, pus collects in the area and creates pressure under the skin. This pressure causes pain. People with weakened immune systems have difficulty fighting infections and get certain abscesses more often.  °SYMPTOMS °Usually an abscess develops on the skin and becomes a painful mass that is red, warm, and tender. If the abscess forms under the skin, you may feel a moveable soft area under the skin. Some abscesses break open (rupture) on their own, but most will continue to get worse without care. The infection can spread deeper into the body and eventually into the bloodstream, causing you to feel ill.  °DIAGNOSIS  °Your caregiver will take your medical history and perform a physical exam. A sample of fluid may also be taken from the abscess to determine what is causing your infection. °TREATMENT  °Your caregiver may prescribe antibiotic medicines to fight the infection. However, taking antibiotics alone usually does not cure an abscess. Your caregiver may need to make a small cut (incision) in the abscess to drain the pus. In some cases, gauze is packed into the abscess to reduce pain and to continue draining the area. °HOME CARE INSTRUCTIONS  °· Only take over-the-counter or prescription medicines for pain, discomfort, or fever as directed by your caregiver. °· If you were prescribed antibiotics, take them as directed. Finish them even if you start to feel better. °· If gauze is used, follow your caregiver's directions for changing the gauze. °· To avoid spreading the infection: °· Keep your draining abscess covered with a  bandage. °· Wash your hands well. °· Do not share personal care items, towels, or whirlpools with others. °· Avoid skin contact with others. °· Keep your skin and clothes clean around the abscess. °· Keep all follow-up appointments as directed by your caregiver. °SEEK MEDICAL CARE IF:  °· You have increased pain, swelling, redness, fluid drainage, or bleeding. °· You have muscle aches, chills, or a general ill feeling. °· You have a fever. °MAKE SURE YOU:  °· Understand these instructions. °· Will watch your condition. °· Will get help right away if you are not doing well or get worse. °  °This information is not intended to replace advice given to you by your health care provider. Make sure you discuss any questions you have with your health care provider. °  °Document Released: 07/11/2005 Document Revised: 04/01/2012 Document Reviewed: 12/14/2011 °Elsevier Interactive Patient Education ©2016 [employer] ° °Incision and Drainage °Incision and drainage is a procedure in which a sac-like structure (cystic structure) is opened and drained. The area to be drained usually contains material such as pus, fluid, or blood.  °LET YOUR CAREGIVER KNOW ABOUT:  °· Allergies to medicine. °· Medicines taken, including vitamins, herbs, eyedrops, over-the-counter medicines, and creams. °· Use of steroids (by mouth or creams). °· Previous problems with anesthetics or numbing medicines. °· History of bleeding problems or blood clots. °· Previous surgery. °· Other health problems, including diabetes and kidney problems. °· Possibility of pregnancy, if this applies. °RISKS AND COMPLICATIONS °· Pain. °· Bleeding. °· Scarring. °· Infection. °BEFORE THE PROCEDURE  °  You may need to have an ultrasound or other imaging tests to see how large or deep your cystic structure is. Blood tests may also be used to determine if you have an infection or how severe the infection is. You may need to have a tetanus shot. PROCEDURE  The affected area  is cleaned with a cleaning fluid. The cyst area will then be numbed with a medicine (local anesthetic). A small incision will be made in the cystic structure. A syringe or catheter may be used to drain the contents of the cystic structure, or the contents may be squeezed out. The area will then be flushed with a cleansing solution. After cleansing the area, it is often gently packed with a gauze or another wound dressing. Once it is packed, it will be covered with gauze and tape or some other type of wound dressing. AFTER THE PROCEDURE   Often, you will be allowed to go home right after the procedure.  You may be given antibiotic medicine to prevent or heal an infection.  If the area was packed with gauze or some other wound dressing, you will likely need to come back in 1 to 2 days to get it removed.  The area should heal in about 14 days.   This information is not intended to replace advice given to you by your health care provider. Make sure you discuss any questions you have with your health care provider.   Take antibiotics as prescribed. Keep wound clean and dry. May wash with soap and water. Avoid touching wound with your hands. Return to the emergency department if you experience increased swelling or redness around the wound, pain in your jaw, fever, chills.

## 2015-12-24 NOTE — ED Notes (Addendum)
Pt. Reports having this abscess on his rt.jaw for a few months,  It was drained [facility] months ago [facility] but pt. Did not take his antibiotics.   The area is red and swollen.  Pt. Denies any fevers.   He does report that he also has areas on his arms that appears to be infected.

## 2015-12-24 NOTE — ED Notes (Signed)
Pt stable, ambulatory, states understanding of discharge iinstructions 

## 2015-12-25 NOTE — ED Provider Notes (Signed)
CSN: [id number]     Arrival date & time 12/24/15  1517 History   First MD Initiated Contact with Patient 12/24/15 1652     Chief Complaint  Patient presents with  . Abscess     (Consider location/radiation/quality/duration/timing/severity/associated sxs/prior Treatment) HPI   Alex [name] is a 30 y.o M With a past mental history of multiple abscesses, IV drug abuse who presents to the emergency room today complaining of an abscess to his right jaw. Patient states that he has had this abscess for several months. He had it lanced last year and did not take his antibiotics and states the abscess has returned. Over the last 2 weeks abscess has gotten much larger and patient has been manually manipulating it to expel purulent drainage. Patient denies jaw pain, dental or gum pain, fevers, trismus, facial swelling, difficulty swallowing or breathing. Patient denies recent IV drug use and states that he has been clean for several months and goes to the methadone clinic.  Past Medical History  Diagnosis Date  . Abscess    History reviewed. No pertinent past surgical history. No family history on file. Social History  Substance Use Topics  . Smoking status: Current Every Day Smoker -- 1.00 packs/day    Types: Cigarettes  . Smokeless tobacco: None  . Alcohol Use: No    Review of Systems  All other systems reviewed and are negative.     Allergies  Doxycycline and Hydrocodone  Home Medications   Prior to Admission medications   Medication Sig Start Date End Date Taking? Authorizing Provider  clindamycin (CLEOCIN) 150 MG capsule Take 1 capsule (150 mg total) by mouth every 6 (six) hours. 03/17/15   Fayrene HelperBowie Tran, PA-C  clindamycin (CLEOCIN) 150 MG capsule Take 1 capsule (150 mg total) by mouth every 6 (six) hours. 05/21/15   Blane OharaJoshua Zavitz, MD  ibuprofen (ADVIL,MOTRIN) 800 MG tablet Take 800 mg by mouth 2 (two) times daily as needed. For pain    Historical Provider, MD  naproxen (NAPROSYN)  500 MG tablet Take 1 tablet (500 mg total) by mouth 2 (two) times daily. 03/17/15   Fayrene HelperBowie Tran, PA-C  oxyCODONE-acetaminophen (PERCOCET/ROXICET) 5-325 MG tablet Take 2 tablets by mouth every 4 (four) hours as needed for severe pain. 12/24/15   Samantha Tripp Dowless, PA-C  sulfamethoxazole-trimethoprim (BACTRIM DS,SEPTRA DS) 800-160 MG tablet Take 1 tablet by mouth 2 (two) times daily. 12/24/15 12/31/15  Samantha Tripp Dowless, PA-C   BP 128/70 mmHg  Pulse 91  Temp(Src) 98.1 F (36.7 C) (Oral)  Resp 18  Ht 6' (1.829 m)  Wt 87.998 kg  BMI 26.31 kg/m2  SpO2 97% Physical Exam  Constitutional: He is oriented to person, place, and time. He appears well-developed and well-nourished. No distress.  HENT:  Head: Normocephalic and atraumatic.  Mouth/Throat: No oropharyngeal exudate.  No swelling under tongue  Eyes: Conjunctivae and EOM are normal. Pupils are equal, round, and reactive to light. Right eye exhibits no discharge. Left eye exhibits no discharge. No scleral icterus.  Cardiovascular: Normal rate, regular rhythm, normal heart sounds and intact distal pulses.  Exam reveals no gallop and no friction rub.   No murmur heard. Pulmonary/Chest: Effort normal and breath sounds normal. No respiratory distress. He has no wheezes. He has no rales. He exhibits no tenderness.  Abdominal: Soft. He exhibits no distension. There is no tenderness. There is no guarding.  Musculoskeletal: Normal range of motion. He exhibits no edema.  Neurological: He is alert and oriented to person, place,  and time.  Skin: Skin is warm and dry. No rash noted. He is not diaphoretic. No erythema. No pallor.  3 cm fluctuant mass to right jawline. Mild surrounding erythema. No mandible tenderness. No trismus. Patient has poor dentition but no dental abscess noted. Oropharynx clear.  Psychiatric: He has a normal mood and affect. His behavior is normal.  Nursing note and vitals reviewed.   ED Course  Procedures (including  critical care time)  INCISION AND DRAINAGE Performed by: Dub Mikes Consent: Verbal consent obtained. Risks and benefits: risks, benefits and alternatives were discussed Type: abscess  Body area: right jaw  Anesthesia: local infiltration  Incision was made with a scalpel.  Local anesthetic: lidocaine 2% with epinephrine  Anesthetic total: 5 ml  Complexity: complex Blunt dissection to break up loculations  Drainage: purulent  Drainage amount: copious  Patient tolerance: Patient tolerated the procedure well with no immediate complications.    Labs Review Labs Reviewed - No data to display  Imaging Review No results found. I have personally reviewed and evaluated these images and lab results as part of my medical decision-making.   EKG Interpretation None      MDM   Final diagnoses:  Abscess   31 year old male with history of multiple skin abscesses and IV drug abuse presents for abscess to right jawline. Patient appears well in ED, nontoxic, nonseptic. Afebrile. I discussed with patient the recommendation to obtain CT maxillofacial to rule out osteomyelitis and administer IV antibiotics. Patient refused this. I discussed in detail the risks and benefits of this. Patient insists that he just wants his abscess to be lanced and to be placed on oral antibiotics. Abscess was amenable to incision and drainage. Copious amount of purulent discharge was expelled. Patient placed on antibiotics and instructed to follow-up with his primary care doctor. Patient given strict return precautions.     Lester Kinsman [place], PA-C 12/25/15 1937  Courteney Randall An, MD 12/26/15 [phone]

## 2016-02-18 ENCOUNTER — Encounter (HOSPITAL_COMMUNITY): Payer: Self-pay | Admitting: *Deleted

## 2016-02-18 ENCOUNTER — Emergency Department (HOSPITAL_COMMUNITY)
Admission: EM | Admit: 2016-02-18 | Discharge: 2016-02-18 | Disposition: A | Discharge status: Home / Self Care | Payer: Self-pay | Attending: Emergency Medicine | Admitting: Emergency Medicine

## 2016-02-18 DIAGNOSIS — L0201 Cutaneous abscess of face: Secondary | ICD-10-CM | POA: Insufficient documentation

## 2016-02-18 DIAGNOSIS — F17210 Nicotine dependence, cigarettes, uncomplicated: Secondary | ICD-10-CM | POA: Insufficient documentation

## 2016-02-18 DIAGNOSIS — L0291 Cutaneous abscess, unspecified: Secondary | ICD-10-CM

## 2016-02-18 MED ORDER — SULFAMETHOXAZOLE-TRIMETHOPRIM 800-160 MG PO TABS: 1.0000 | ORAL_TABLET | Freq: Two times a day (BID) | ORAL | Status: AC

## 2016-02-18 MED ORDER — OXYCODONE-ACETAMINOPHEN 5-325 MG PO TABS
1.0000 | ORAL_TABLET | Freq: Once | ORAL | Status: AC
  Administered 2016-02-18: 1 via ORAL
  Filled 2016-02-18: qty 1

## 2016-02-18 MED ORDER — SULFAMETHOXAZOLE-TRIMETHOPRIM 800-160 MG PO TABS
1.0000 | ORAL_TABLET | Freq: Once | ORAL | Status: AC
  Administered 2016-02-18: 1 via ORAL
  Filled 2016-02-18: qty 1

## 2016-02-18 MED ORDER — LIDOCAINE-EPINEPHRINE (PF) 2 %-1:200000 IJ SOLN
20.0000 mL | Freq: Once | INTRAMUSCULAR | Status: AC
  Administered 2016-02-18: 20 mL
  Filled 2016-02-18: qty 20

## 2016-02-18 NOTE — ED Provider Notes (Signed)
Patient [facility] in the Emergency Department [facility] Patient reports facial abscess Exam : awake/alert, large abscess to lower right face.  No neck swelling.  No trismus Plan: will need I&D as well as oral antibiotics Pt agreeable with plan    Alex Rhineonald Kayelyn Lemon, MD 02/18/16 [phone]

## 2016-02-18 NOTE — ED Provider Notes (Signed)
CSN: [id number]     Arrival date & time 02/18/16  0712 History   First MD Initiated Contact with Patient 02/18/16 [phone]     Chief Complaint  Patient presents with  . Abscess   HPI   Alex [name] is a 30 y.o. male PMH significant for IVDA, abscess presenting with a 3 day history of right-sided facial abscess. He was seen in March for similar complaints, was prescribed Bactrim but did not start his prescription until approximately 2 weeks after he was seen. His girlfriend, present at bedside, states that he did not take his prescription as instructed. He presents today with recurrent abscess. He states he has been trying warm compresses at home without relief. He denies fevers, chills, difficulty swallowing, trismus, neck swelling, chest pain, shortness of breath, nausea, vomiting.  Past Medical History  Diagnosis Date  . Abscess    History reviewed. No pertinent past surgical history. History reviewed. No pertinent family history. Social History  Substance Use Topics  . Smoking status: Current Every Day Smoker -- 1.00 packs/day    Types: Cigarettes  . Smokeless tobacco: None  . Alcohol Use: No    Review of Systems  Ten systems are reviewed and are negative for acute change except as noted in the HPI  Allergies  Doxycycline and Hydrocodone  Home Medications   Prior to Admission medications   Medication Sig Start Date End Date Taking? Authorizing Provider  clindamycin (CLEOCIN) 150 MG capsule Take 1 capsule (150 mg total) by mouth every 6 (six) hours. Patient not taking: Reported on 02/18/2016 03/17/15   Fayrene HelperBowie Tran, PA-C  clindamycin (CLEOCIN) 150 MG capsule Take 1 capsule (150 mg total) by mouth every 6 (six) hours. Patient not taking: Reported on 02/18/2016 05/21/15   Blane OharaJoshua Zavitz, MD  naproxen (NAPROSYN) 500 MG tablet Take 1 tablet (500 mg total) by mouth 2 (two) times daily. Patient not taking: Reported on 02/18/2016 03/17/15   Fayrene HelperBowie Tran, PA-C  oxyCODONE-acetaminophen  (PERCOCET/ROXICET) 5-325 MG tablet Take 2 tablets by mouth every 4 (four) hours as needed for severe pain. Patient not taking: Reported on 02/18/2016 12/24/15   Aymar Whitfill Tripp Dowless, PA-C   BP 118/77 mmHg  Pulse 68  Temp(Src) 98.1 F (36.7 C) (Oral)  Resp 16  Ht 6' (1.829 m)  Wt 86.183 kg  BMI 25.76 kg/m2  SpO2 100% Physical Exam  Constitutional: He appears well-developed and well-nourished. No distress.  HENT:  Head: Normocephalic and atraumatic.  Mouth/Throat: Oropharynx is clear and moist. No oropharyngeal exudate.  No trismus  Eyes: Conjunctivae are normal. Right eye exhibits no discharge. Left eye exhibits no discharge. No scleral icterus.  Neck: No tracheal deviation present.  No neck swelling  Cardiovascular: Normal rate, regular rhythm, normal heart sounds and intact distal pulses.  Exam reveals no gallop and no friction rub.   No murmur heard. Pulmonary/Chest: Effort normal. No respiratory distress.  Abdominal: Soft. He exhibits no distension.  Musculoskeletal: He exhibits no edema.  Lymphadenopathy:    He has no cervical adenopathy.  Neurological: He is alert. Coordination normal.  Skin: Skin is warm and dry. No rash noted. He is not diaphoretic. No erythema.  4 x 3 cm right-sided fluctuant facial abscess superficial to right mandible. Very minimal surrounding erythema. No purulent drainage noted.  Psychiatric: He has a normal mood and affect. His behavior is normal.  Nursing note and vitals reviewed.  ED Course  .Marland Kitchen.Incision and Drainage Date/Time: 02/18/2016 8:59 AM Performed by: Althea GrimmerILEY, Dreana Britz NICOLE Authorized by:  Althea Grimmer NICOLE Consent: Verbal consent obtained. Consent given by: patient Patient identity confirmed: verbally with patient and arm band Type: abscess Body area: head Location details: face Anesthesia: local infiltration Local anesthetic: lidocaine 2% with epinephrine Scalpel size: 11 Incision type: single straight Drainage:  purulent Drainage amount: copious   MDM   Final diagnoses:  Abscess   Patient nontoxic-appearing, vital signs stable.  Patient states he does not want imaging at this time. I do not feel this is required, as abscess appears to be very superficial.  Patient with skin abscess amenable to incision and drainage.  Mild signs of cellulitis is surrounding skin.  Will d/c to home with bactrim.   Dr. Bebe Shaggy also evaluated patient and agrees with plan.  Melton Krebs, PA-C 02/18/16 1610  Zadie Rhine, MD 02/18/16 [phone]

## 2016-02-18 NOTE — Discharge Instructions (Signed)
Mr. Alex [name],  Nice meeting you! Please follow-up with your primary care provider. Return to the emergency department if you develop fevers, chills, redness around the abscess, nausea/vomiting, new/worsening symptoms. Feel better soon!  S. Lane HackerNicole Kelvyn Schunk, PA-C Abscess An abscess is an infected area that contains a collection of pus and debris.It can occur in almost any part of the body. An abscess is also known as a furuncle or boil. CAUSES  An abscess occurs when tissue gets infected. This can occur from blockage of oil or sweat glands, infection of hair follicles, or a minor injury to the skin. As the body tries to fight the infection, pus collects in the area and creates pressure under the skin. This pressure causes pain. People with weakened immune systems have difficulty fighting infections and get certain abscesses more often.  SYMPTOMS Usually an abscess develops on the skin and becomes a painful mass that is red, warm, and tender. If the abscess forms under the skin, you may feel a moveable soft area under the skin. Some abscesses break open (rupture) on their own, but most will continue to get worse without care. The infection can spread deeper into the body and eventually into the bloodstream, causing you to feel ill.  DIAGNOSIS  Your caregiver will take your medical history and perform a physical exam. A sample of fluid may also be taken from the abscess to determine what is causing your infection. TREATMENT  Your caregiver may prescribe antibiotic medicines to fight the infection. However, taking antibiotics alone usually does not cure an abscess. Your caregiver may need to make a small cut (incision) in the abscess to drain the pus. In some cases, gauze is packed into the abscess to reduce pain and to continue draining the area. HOME CARE INSTRUCTIONS   Only take over-the-counter or prescription medicines for pain, discomfort, or fever as directed by your caregiver.  If you  were prescribed antibiotics, take them as directed. Finish them even if you start to feel better.  If gauze is used, follow your caregiver's directions for changing the gauze.  To avoid spreading the infection:  Keep your draining abscess covered with a bandage.  Wash your hands well.  Do not share personal care items, towels, or whirlpools with others.  Avoid skin contact with others.  Keep your skin and clothes clean around the abscess.  Keep all follow-up appointments as directed by your caregiver. SEEK MEDICAL CARE IF:   You have increased pain, swelling, redness, fluid drainage, or bleeding.  You have muscle aches, chills, or a general ill feeling.  You have a fever. MAKE SURE YOU:   Understand these instructions.  Will watch your condition.  Will get help right away if you are not doing well or get worse.   This information is not intended to replace advice given to you by your health care provider. Make sure you discuss any questions you have with your health care provider.   Document Released: 07/11/2005 Document Revised: 04/01/2012 Document Reviewed: 12/14/2011 Elsevier Interactive Patient Education [employer]2016 [employer] Inc.

## 2016-02-18 NOTE — ED Notes (Signed)
Pt presents today with a reported reoccurring abscess to RT side of face.  Pt finished antibx  For abscess at same site.

## 2018-05-12 ENCOUNTER — Encounter: Payer: Self-pay | Admitting: Emergency Medicine

## 2018-05-12 ENCOUNTER — Other Ambulatory Visit: Payer: Self-pay

## 2018-05-12 ENCOUNTER — Emergency Department: Payer: Self-pay

## 2018-05-12 ENCOUNTER — Emergency Department
Admission: EM | Admit: 2018-05-12 | Discharge: 2018-05-12 | Disposition: A | Discharge status: Home / Self Care | Payer: Self-pay | Attending: Emergency Medicine | Admitting: Emergency Medicine

## 2018-05-12 DIAGNOSIS — L03119 Cellulitis of unspecified part of limb: Secondary | ICD-10-CM

## 2018-05-12 DIAGNOSIS — F17210 Nicotine dependence, cigarettes, uncomplicated: Secondary | ICD-10-CM | POA: Insufficient documentation

## 2018-05-12 DIAGNOSIS — L03114 Cellulitis of left upper limb: Secondary | ICD-10-CM | POA: Insufficient documentation

## 2018-05-12 MED ORDER — LIDOCAINE-EPINEPHRINE 2 %-1:100000 IJ SOLN
30.0000 mL | Freq: Once | INTRAMUSCULAR | Status: AC
  Administered 2018-05-12: 30 mL via INTRADERMAL
  Filled 2018-05-12: qty 2

## 2018-05-12 MED ORDER — OXYCODONE-ACETAMINOPHEN 5-325 MG PO TABS
1.0000 | ORAL_TABLET | Freq: Once | ORAL | Status: AC
  Administered 2018-05-12: 1 via ORAL
  Filled 2018-05-12: qty 1

## 2018-05-12 MED ORDER — CLINDAMYCIN HCL 300 MG PO CAPS: 300.0000 mg | ORAL_CAPSULE | Freq: Four times a day (QID) | ORAL | 0 refills | Status: AC

## 2018-05-12 MED ORDER — CLINDAMYCIN PHOSPHATE 300 MG/2ML IJ SOLN
600.0000 mg | Freq: Once | INTRAMUSCULAR | Status: AC
Start: 2018-05-12 — End: 2018-05-12
  Administered 2018-05-12: 600 mg via INTRAMUSCULAR
  Filled 2018-05-12: qty 4

## 2018-05-12 NOTE — ED Notes (Signed)
Seen triage note.. Presents with pain and swelling to left wrist area  Denies any injury buy may have had an insect sting  Good pulses

## 2018-05-12 NOTE — Discharge Instructions (Addendum)
Return to the emergency department for any fevers increasing pain, swelling, numbness or tingling.  Take antibiotics as prescribed.  Follow-up with emergency department or PCP in 2 days for recheck to make sure you are seeing improvement.

## 2018-05-12 NOTE — ED Triage Notes (Signed)
Pt presents with left wrist pain x 4 days, worsening today. He states it felt like a bug bite when it began, and has gotten worse. He states that today it is much worse. He has been taking tylenol and ibuprofen with no relief. Pt states he was IV drug user but that he has been on suboxone x 1 month and has not used since then. Pt alert & oriented.

## 2018-05-12 NOTE — ED Triage Notes (Signed)
Pt denies any trauma or injury to the wrist.

## 2018-05-12 NOTE — ED Provider Notes (Signed)
[facility] EMERGENCY DEPARTMENT Provider Note   CSN: [id number] Arrival date & time: 05/12/18  1529     History   Chief Complaint Chief Complaint  Patient presents with  . Wrist Pain    HPI [name] is a 32 y.o. male with a history of IV drug abuse presents to the emergency department for evaluation of left wrist pain and swelling.  He denies any IV drug use over the last 2 months.  Patient states he felt like had a bug bite 4 days ago to the volar aspect of the left wrist just above the radial artery.  He describes some itching.  Over the last 24 hours pain and swelling have increased.  He describes pain with wrist range of motion, flexion and extension.  He denies any numbness or tingling.  He is able to make a fist with his digits.  No drainage or fevers.  He denies any redness streaking up the arm.  He has not had any medications for pain.  He has not been on any antibiotics.  His tetanus is up-to-date.  HPI  Past Medical History:  Diagnosis Date  . Abscess     There are no active problems to display for this patient.   History reviewed. No pertinent surgical history.      Home Medications    Prior to Admission medications   Medication Sig Start Date End Date Taking? Authorizing Provider  clindamycin (CLEOCIN) 300 MG capsule Take 1 capsule (300 mg total) by mouth 4 (four) times daily for 10 days. 05/12/18 05/22/18  Evon Slack, PA-C  naproxen (NAPROSYN) 500 MG tablet Take 1 tablet (500 mg total) by mouth 2 (two) times daily. Patient not taking: Reported on 02/18/2016 03/17/15   Fayrene Helper, PA-C  oxyCODONE-acetaminophen (PERCOCET/ROXICET) 5-325 MG tablet Take 2 tablets by mouth every 4 (four) hours as needed for severe pain. Patient not taking: Reported on 02/18/2016 12/24/15   Dowless, Lester Kinsman, PA-C    Family History History reviewed. No pertinent family history.  Social History Social History   Tobacco Use  . Smoking status:  Current Every Day Smoker    Packs/day: 1.00    Types: Cigarettes  . Smokeless tobacco: Never Used  Substance Use Topics  . Alcohol use: No  . Drug use: No     Allergies   Doxycycline and Hydrocodone   Review of Systems Review of Systems  Constitutional: Negative for chills, fatigue and fever.  Respiratory: Negative for shortness of breath.   Cardiovascular: Negative for chest pain.  Musculoskeletal: Positive for arthralgias and joint swelling. Negative for gait problem.  Skin: Negative for rash and wound.  Neurological: Negative for dizziness and headaches.     Physical Exam Updated Vital Signs BP 118/81 (BP Location: Right Arm)   Pulse 85   Temp 97.7 F (36.5 C) (Oral)   Resp 18   Ht 6' (1.829 m)   Wt 83.9 kg (185 lb)   SpO2 100%   BMI 25.09 kg/m   Physical Exam  Constitutional: He is oriented to person, place, and time. He appears well-developed and well-nourished.  HENT:  Head: Normocephalic and atraumatic.  Eyes: Conjunctivae are normal.  Neck: Normal range of motion.  Cardiovascular: Normal rate.  Pulmonary/Chest: Effort normal. No respiratory distress.  Musculoskeletal:  Examination of the left upper extremity shows multiple superficial wounds that do not appear to be infected.  Examination of the wrist and hand show patient is able to make a  fist and fully extend the digits with no sign of any flexor tenosynovitis.  His compartments are soft throughout the forearm.  His wrist is slightly swollen with most of the swelling on the volar aspect of the wrist along the radial artery with no significant induration of the tissue or palpable fluctuance.  Radial pulses are intact.  2+ cap refill throughout the left hand.  He is tender to palpation throughout the wrist joint but mostly on the volar aspect.  Bedside ultrasound is used to evaluate for significant abscess formation, no abscess is visualized.  Good visualization was seen of the radial and ulnar artery as well  as the median nerve.  No focal abscess seen.  Neurological: He is alert and oriented to person, place, and time.  Skin: Skin is warm. No rash noted.  Psychiatric: He has a normal mood and affect. His behavior is normal. Thought content normal.     ED Treatments / Results  Labs (all labs ordered are listed, but only abnormal results are displayed) Labs Reviewed - No data to display  EKG None  Radiology Dg Wrist Complete Left  Result Date: 05/12/2018 CLINICAL DATA:  LEFT wrist pain for 4 days worsened today, redness and swelling at palmar side EXAM: LEFT WRIST - COMPLETE 3+ VIEW COMPARISON:  None FINDINGS: Soft tissue swelling especially at radial and anterior aspects of the LEFT wrist and distal forearm into hand. Osseous mineralization normal. Joint spaces preserved. No acute fracture, dislocation, or bone destruction. IMPRESSION: Significant soft tissue swelling without osseous abnormalities. Electronically Signed   By: Ulyses SouthwardMark  Boles M.D.   On: 05/12/2018 16:13    Procedures Procedures (including critical care time)  Medications Ordered in ED Medications  lidocaine-EPINEPHrine (XYLOCAINE W/EPI) 2 %-1:100000 (with pres) injection 30 mL (30 mLs Intradermal Given 05/12/18 1637)  oxyCODONE-acetaminophen (PERCOCET/ROXICET) 5-325 MG per tablet 1 tablet (1 tablet Oral Given 05/12/18 1636)  clindamycin (CLEOCIN) injection 600 mg (600 mg Intramuscular Given 05/12/18 1636)     Initial Impression / Assessment and Plan / ED Course  I have reviewed the triage vital signs and the nursing notes.  Pertinent labs & imaging results that were available during my care of the patient were reviewed by me and considered in my medical decision making (see chart for details).    32 year old male with left wrist cellulitis with no abscess formation visualized.  He is not running any fevers.  Compartments are soft.  He is able to make a full fist with no pain or swelling throughout the digits.  He is placed  on IM clindamycin followed by p.o. clindamycin for 10 days.  He is given 1 pain tablet here in the emergency department.  He is educated on follow-up and will return for any fevers increasing pain, swelling numbness or tingling.  He will follow-up with PCP or emergency department 2 days for recheck.  Final Clinical Impressions(s) / ED Diagnoses   Final diagnoses:  Cellulitis of wrist    ED Discharge Orders        Ordered    clindamycin (CLEOCIN) 300 MG capsule  4 times daily     05/12/18 1707       Ronnette JuniperGaines, Makenze Ellett C, PA-C 05/12/18 1717    Dionne BucySiadecki, Sebastian, MD 05/12/18 2211

## 2022-01-24 ENCOUNTER — Emergency Department: Payer: Self-pay

## 2022-01-24 ENCOUNTER — Emergency Department
Admission: EM | Admit: 2022-01-24 | Discharge: 2022-01-24 | Disposition: A | Discharge status: Home / Self Care | Payer: Self-pay | Attending: Emergency Medicine | Admitting: Emergency Medicine

## 2022-01-24 DIAGNOSIS — R7401 Elevation of levels of liver transaminase levels: Secondary | ICD-10-CM | POA: Insufficient documentation

## 2022-01-24 DIAGNOSIS — R609 Edema, unspecified: Secondary | ICD-10-CM

## 2022-01-24 DIAGNOSIS — R7989 Other specified abnormal findings of blood chemistry: Secondary | ICD-10-CM

## 2022-01-24 DIAGNOSIS — M7989 Other specified soft tissue disorders: Secondary | ICD-10-CM | POA: Insufficient documentation

## 2022-01-24 DIAGNOSIS — R790 Abnormal level of blood mineral: Secondary | ICD-10-CM | POA: Insufficient documentation

## 2022-01-24 LAB — HEPATIC FUNCTION PANEL
ALT: 97 U/L — ABNORMAL HIGH (ref 0–44)
AST: 70 U/L — ABNORMAL HIGH (ref 15–41)
Albumin: 4 g/dL (ref 3.5–5.0)
Alkaline Phosphatase: 52 U/L (ref 38–126)
Bilirubin, Direct: <0.1 mg/dL (ref 0.0–0.2)
Total Bilirubin: 0.5 mg/dL (ref 0.3–1.2)
Total Protein: 7.2 g/dL (ref 6.5–8.1)

## 2022-01-24 LAB — URINALYSIS, ROUTINE W REFLEX MICROSCOPIC
Bilirubin Urine: NEGATIVE
Glucose, UA: NEGATIVE mg/dL
Hgb urine dipstick: NEGATIVE
Ketones, ur: NEGATIVE mg/dL
Leukocytes,Ua: NEGATIVE
Nitrite: NEGATIVE
Protein, ur: NEGATIVE mg/dL
Specific Gravity, Urine: 1.008 (ref 1.005–1.030)
pH: 6 (ref 5.0–8.0)

## 2022-01-24 LAB — BASIC METABOLIC PANEL
Anion gap: 6 (ref 5–15)
BUN: 12 mg/dL (ref 6–20)
CO2: 27 mmol/L (ref 22–32)
Calcium: 8.9 mg/dL (ref 8.9–10.3)
Chloride: 104 mmol/L (ref 98–111)
Creatinine, Ser: 0.82 mg/dL (ref 0.61–1.24)
GFR, Estimated: >60 mL/min (ref >60)
Glucose, Bld: 103 mg/dL — ABNORMAL HIGH (ref 70–99)
Potassium: 4 mmol/L (ref 3.5–5.1)
Sodium: 137 mmol/L (ref 135–145)

## 2022-01-24 LAB — CBC
HCT: 46 % (ref 39.0–52.0)
Hemoglobin: 15.4 g/dL (ref 13.0–17.0)
MCH: 30.1 pg (ref 26.0–34.0)
MCHC: 33.5 g/dL (ref 30.0–36.0)
MCV: 90 fL (ref 80.0–100.0)
Platelets: 160 10*3/uL (ref 150–400)
RBC: 5.11 MIL/uL (ref 4.22–5.81)
RDW: 12 % (ref 11.5–15.5)
WBC: 5.6 10*3/uL (ref 4.0–10.5)
nRBC: 0 % (ref 0.0–0.2)

## 2022-01-24 LAB — BRAIN NATRIURETIC PEPTIDE: B Natriuretic Peptide: 140.3 pg/mL — ABNORMAL HIGH (ref 0.0–100.0)

## 2022-01-24 MED ORDER — FUROSEMIDE 20 MG PO TABS: 20.0000 mg | ORAL_TABLET | Freq: Every day | ORAL | 0 refills | Status: AC

## 2022-01-24 MED ORDER — IBUPROFEN 800 MG PO TABS: 800.0000 mg | ORAL_TABLET | Freq: Three times a day (TID) | ORAL | 0 refills | Status: AC | PRN

## 2022-01-24 NOTE — ED Provider Notes (Signed)
? ?[facility] ?Provider Note ? ? ? Event Date/Time  ? First MD Initiated Contact with Patient 01/24/22 1923   ?  (approximate) ? ? ?History  ? ?Edema ? ? ?HPI ? ?WALLEY MARSH is a 36 y.o. male who comes in with concern for bilateral hand and feet swelling.  Patient does report having teeth pulled on 4/4.  Swelling started 3 days ago.  Denies any known history of CHF.  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  He denies any fevers.  He does report some generalized swelling in his legs and his hands.  Denies any chest pain or shortness of breath.  Currently on amoxicillin, ibuprofen.  Denies any steroids. ? ?Physical Exam  ? ?Triage Vital Signs: ?ED Triage Vitals  ?Enc Vitals Group  ?   BP 01/24/22 1720 133/89  ?   Pulse Rate 01/24/22 1720 78  ?   Resp 01/24/22 1720 18  ?   Temp 01/24/22 1720 98.2 ?F (36.8 ?C)  ?   Temp Source 01/24/22 1720 Oral  ?   SpO2 01/24/22 1720 96 %  ?   Weight 01/24/22 1721 230 lb (104.3 kg)  ?   Height --   ?   Head Circumference --   ?   Peak Flow --   ?   Pain Score 01/24/22 1720 8  ?   Pain Loc --   ?   Pain Edu? --   ?   Excl. in [place]? --   ? ? ?Most recent vital signs: ?Vitals:  ? 01/24/22 1720  ?BP: 133/89  ?Pulse: 78  ?Resp: 18  ?Temp: 98.2 ?F (36.8 ?C)  ?SpO2: 96%  ? ? ? ?General: Awake, no distress. ?CV:  Good peripheral perfusion.  No obvious murmur heard ?Resp:  Normal effort.  ?Abd:  No distention.  ?Other:  Some mild swelling in the legs with good distal pulses.  No obvious swelling in the hands. ? ? ?ED Results / Procedures / Treatments  ? ?Labs ?(all labs ordered are listed, but only abnormal results are displayed) ?Labs Reviewed  ?BRAIN NATRIURETIC PEPTIDE - Abnormal; Notable for the following components:  ?    Result Value  ? B Natriuretic Peptide 140.3 (*)   ? All other components within normal limits  ?BASIC METABOLIC PANEL - Abnormal; Notable for the following components:  ? Glucose, Bld  103 (*)   ? All other components within normal limits  ?HEPATIC FUNCTION PANEL - Abnormal; Notable for the following components:  ? AST 70 (*)   ? ALT 97 (*)   ? All other components within normal limits  ?CBC  ?URINALYSIS, ROUTINE W REFLEX MICROSCOPIC  ? ? ? ?EKG ? ?My interpretation of EKG: ? ?Normal sinus rate of 63 without any ST elevation, T wave version lead III, normal intervals ? ?RADIOLOGY ?I have reviewed the xray personally and see no evidence of pneumonia or pleural effusion ? ?DVT ultrasound is negative ?PROCEDURES: ? ?Critical Care performed: No ? ?Procedures ? ? ?MEDICATIONS ORDERED IN ED: ?Medications - No data to display ? ? ?IMPRESSION / MDM / ASSESSMENT AND PLAN / ED COURSE  ?I reviewed the triage vital signs and the nursing notes. ?             ?               ? ?Differential diagnosis includes, but is not  limited to CHF, liver failure, low albumin, nephrotic syndrome, DVT ? ? ?BNP is slightly elevated ?BMP reassuring ?CBC normal ?Hepatic function does show some slight elevation in LFTs but no evidence of liver failure or significantly low albumin ? ?I considered the possibility of endocarditis, heart failure but patient has no fever, no obvious murmur and his swelling is very minimal in nature.   trace in his legs I do not see any evidence of any swelling in his hands.  Given slightly elevated BNP will start on Lasix-follow-up with the heart failure clinic for echocardiogram.  His chest x-ray without any fluid in his lungs and I considered admission for patient but given reassuring vital signs I think it is reasonable for patient to go home and follow-up outpatient.  We discussed leg elevation and compression socks and patient expressed understanding felt comfortable with plan ? ? ? ?FINAL CLINICAL IMPRESSION(S) / ED DIAGNOSES  ? ?Final diagnoses:  ?Swelling  ?Elevated brain natriuretic peptide (BNP) level  ? ? ? ?Rx / DC Orders  ? ?ED Discharge Orders   ? ?      Ordered  ?  AMB referral to CHF  clinic       ? 01/24/22 2229  ?  furosemide (LASIX) 20 MG tablet  Daily       ? 01/24/22 2229  ? ?  ?  ? ?  ? ? ? ?Note:  This document was prepared using Dragon voice recognition software and may include unintentional dictation errors. ?  ?Vanessa Millard, MD ?01/24/22 2231 ? ?

## 2022-01-24 NOTE — ED Triage Notes (Signed)
Pt comes pov with bilateral hand and feet swelling. Pt had all of his teeth pulled/cut out to get implants on 4/4 and swelling started 3 days ago. Unsure if related. Pt denies any hx of chf.  ?

## 2022-01-24 NOTE — Discharge Instructions (Signed)
Keep your legs elevated.  Use compression socks.  Take Lasix in the morning once daily for 3 days and follow-up with the heart failure clinic to discuss echocardiogram and return to the ER if develop fevers, shortness of breath or any other concern ?

## 2022-01-25 NOTE — Progress Notes (Deleted)
?   Patient ID: Alex [name], male    DOB: 06/20/1986, 36 y.o.   MRN: [id number] ? ?HPI ? ?Alex [name] is a 36 y/o male with a history of ? ?No echo for review.  ? ?Was in the ED 01/24/22 due to bilateral hand and feet swelling. Due to elevated BNP, oral lasix was started and he was released.  ? ?He presents today for his initial visit with a chief complaint of  ? ?Review of Systems ? ? ? ?Physical Exam ? ?  ? ?Assessment & Plan: ? ?1: Chronic heart failure with unknown ejection fraction- ?- NYHA class ?- has scheduled echo for  ?- BMP 01/24/22 reviewed and showed sodium 137, potassium 4.0, creatinine 0.82 & GFR >60 ?- BNP 01/24/22 was 140.3 ?

## 2022-01-29 ENCOUNTER — Ambulatory Visit: Payer: Self-pay | Admitting: Family

## 2022-01-29 ENCOUNTER — Telehealth: Payer: Self-pay | Admitting: Family

## 2022-01-29 NOTE — Telephone Encounter (Signed)
Patient did not show for his Heart Failure Clinic appointment on 01/29/22. Will attempt to reschedule.   ?

## 2022-02-20 ENCOUNTER — Ambulatory Visit: Payer: Self-pay | Admitting: Family

## 2022-03-05 ENCOUNTER — Ambulatory Visit: Payer: Self-pay | Admitting: Family

## 2023-07-16 IMAGING — CR DG CHEST 2V
2 series · 2 of 2 positions shown · non-contrast
Comparison: 12/03/2014

CLINICAL DATA: Swelling

EXAM:
CHEST - 2 VIEW

[chest pa]
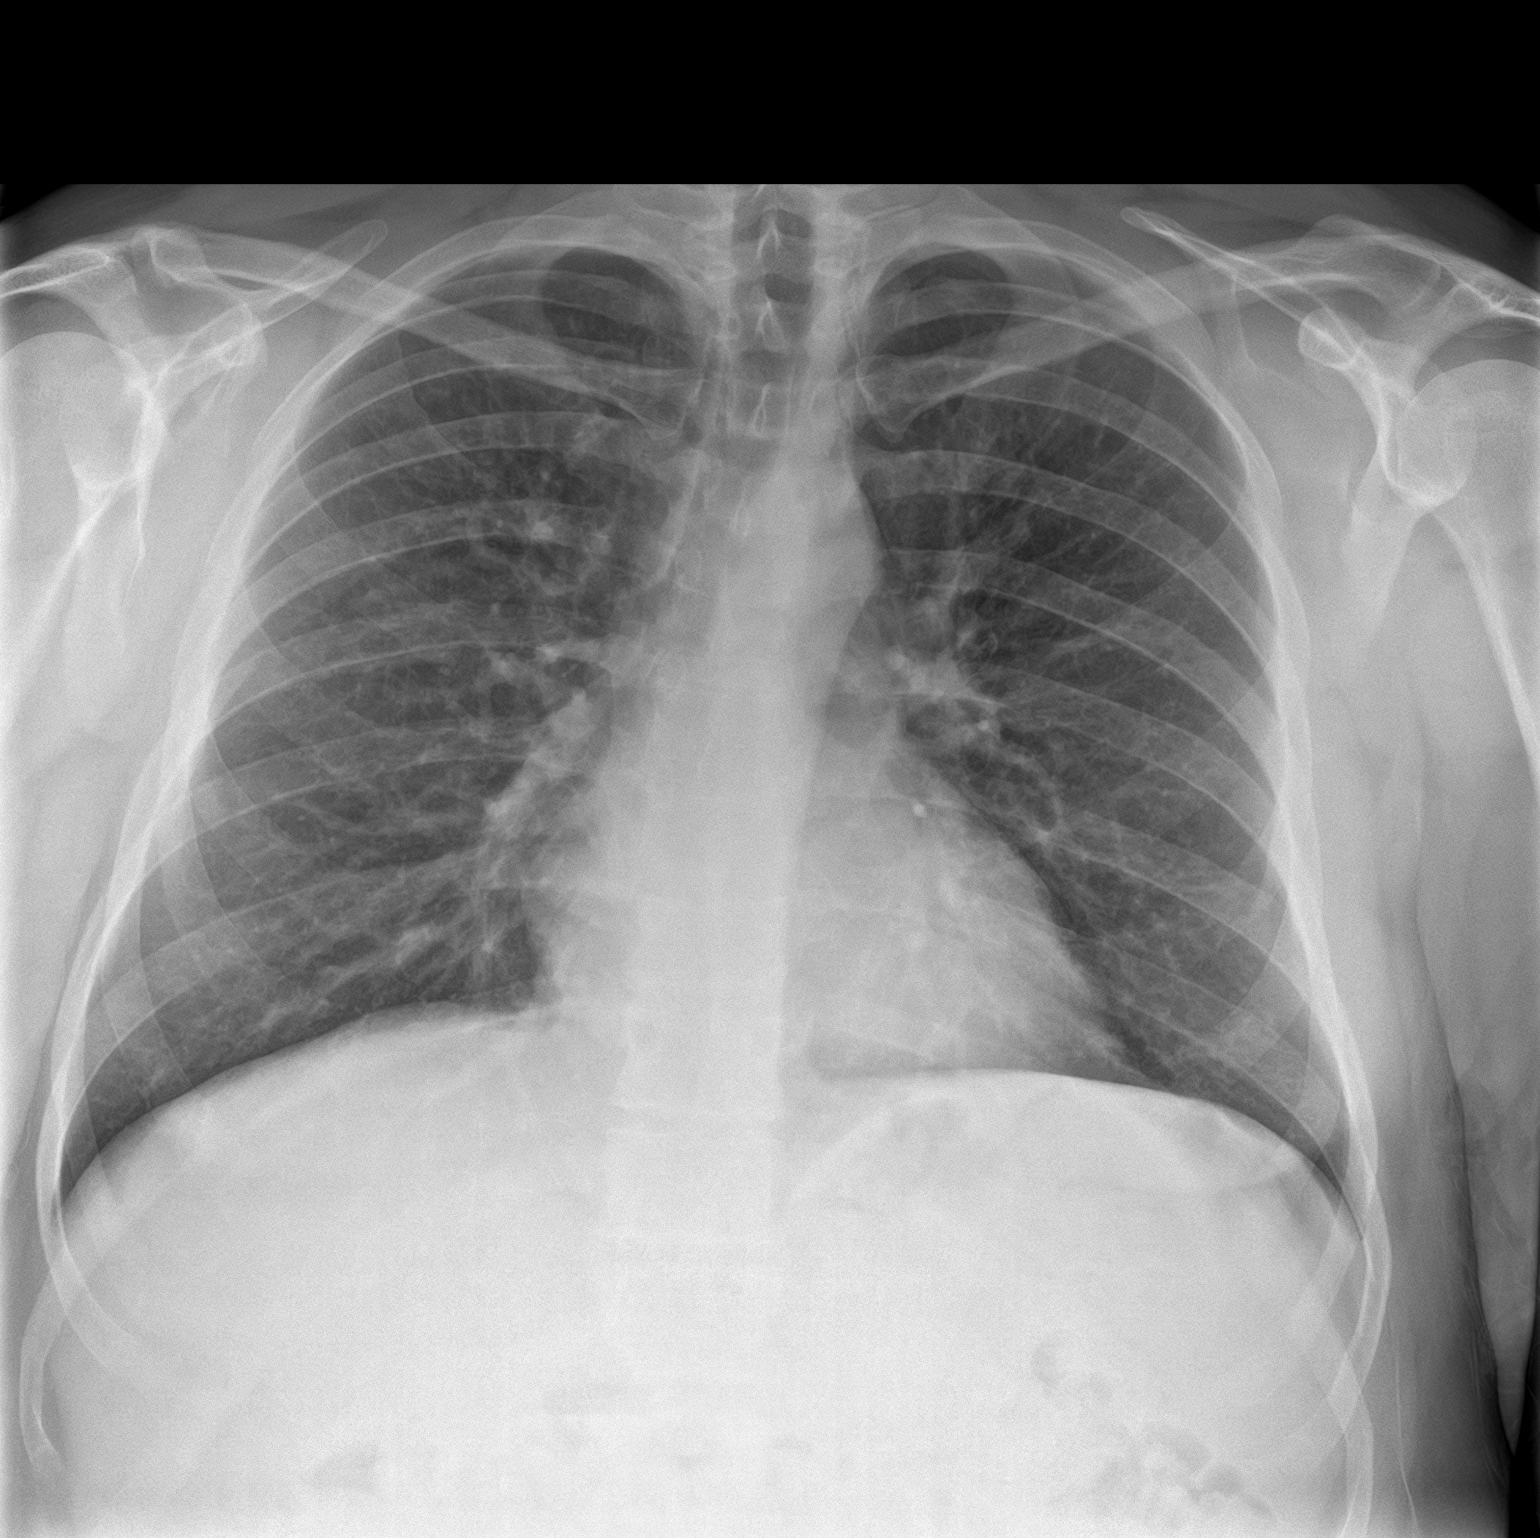

[chest lat]
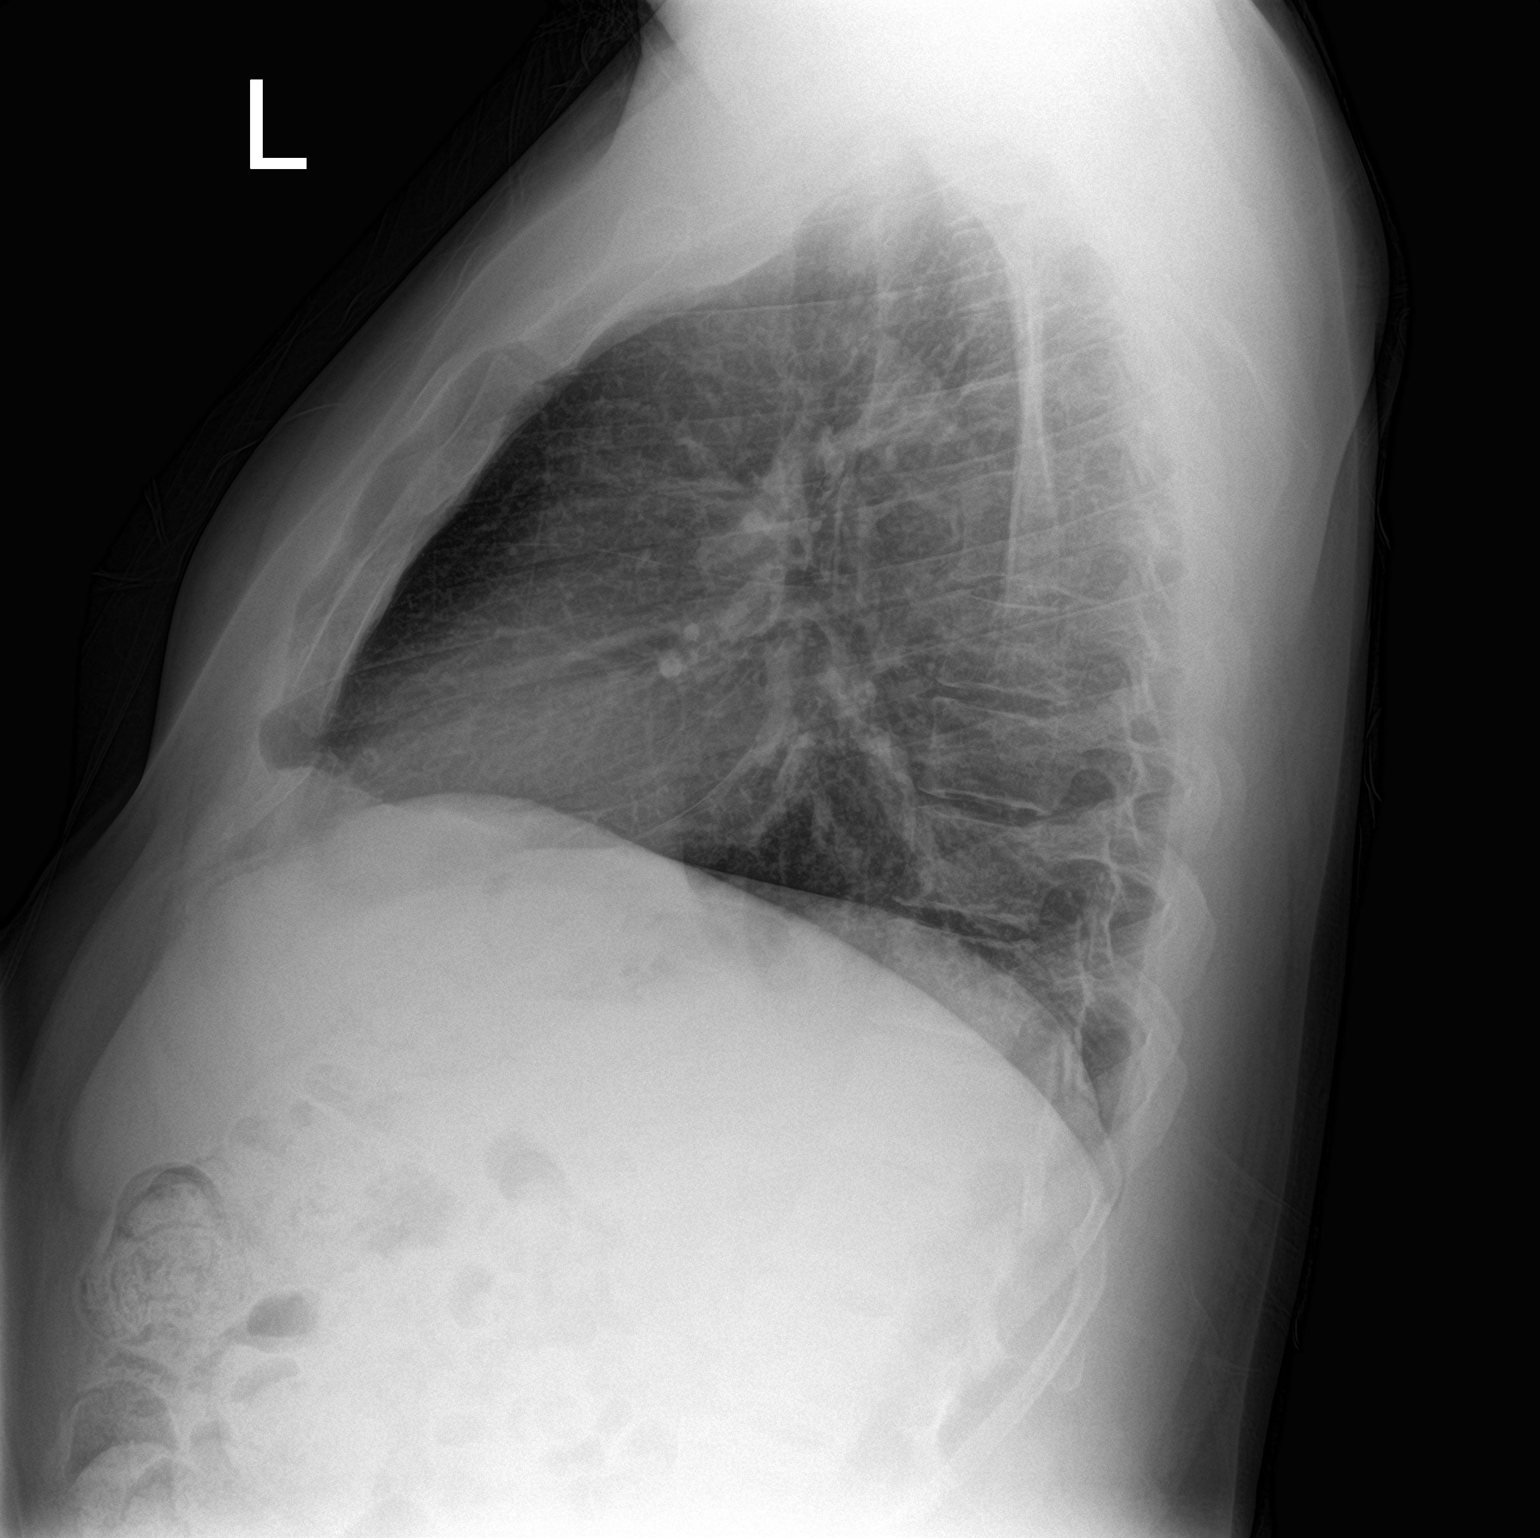

[2 of 2 positions shown; findings below may reference images not displayed]

FINDINGS: The heart size and mediastinal contours are within normal limits.
Both lungs are clear. The visualized skeletal structures are
unremarkable.
IMPRESSION: No active cardiopulmonary disease.

## 2023-07-16 IMAGING — US US EXTREM LOW VENOUS
1 series · 14 of 24 positions shown · non-contrast
Comparison: None.

CLINICAL DATA: Bilateral lower extremity swelling for 4 days.

EXAM:
BILATERAL LOWER EXTREMITY VENOUS DOPPLER ULTRASOUND
TECHNIQUE: Gray-scale sonography with compression, as well as color and duplex
ultrasound, were performed to evaluate the deep venous system(s)
from the level of the common femoral vein through the popliteal and
proximal calf veins.

[Series 1: us venous img lower bilat (dvt) · portal-venous · 14 of 56 slices shown]
[im 1/56]
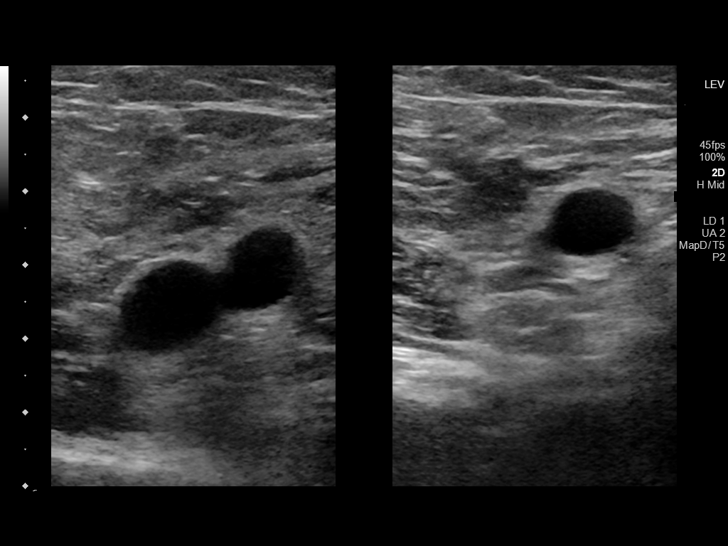
[im 5/56]
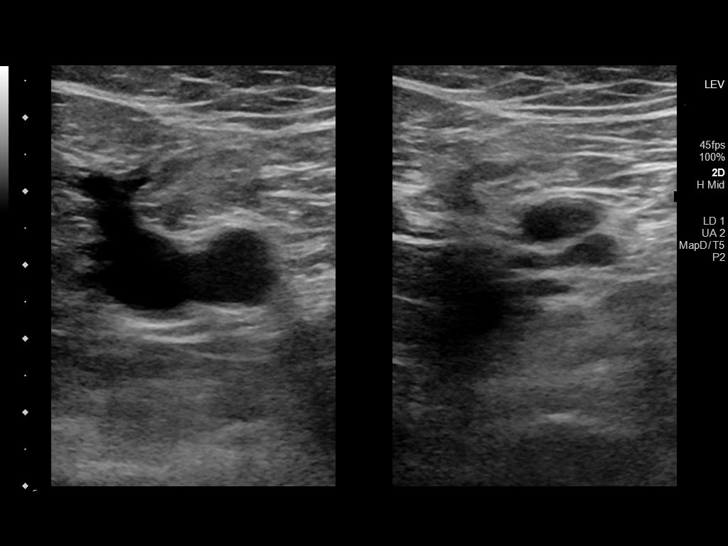
[im 10/56]
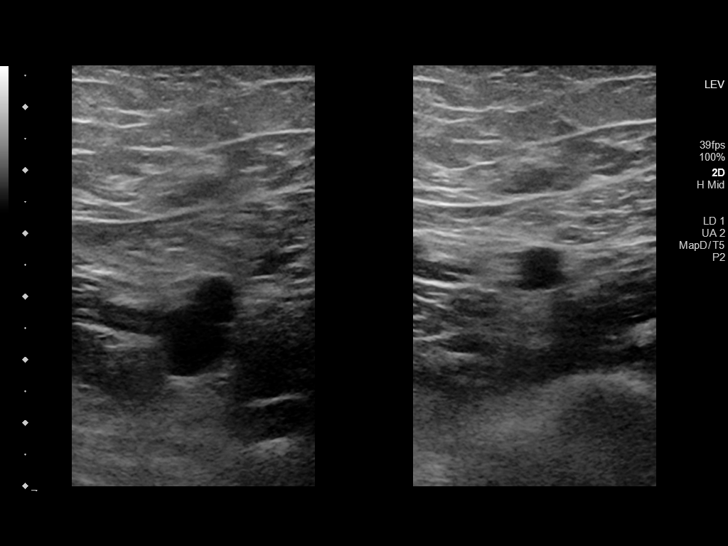
[im 15/56]
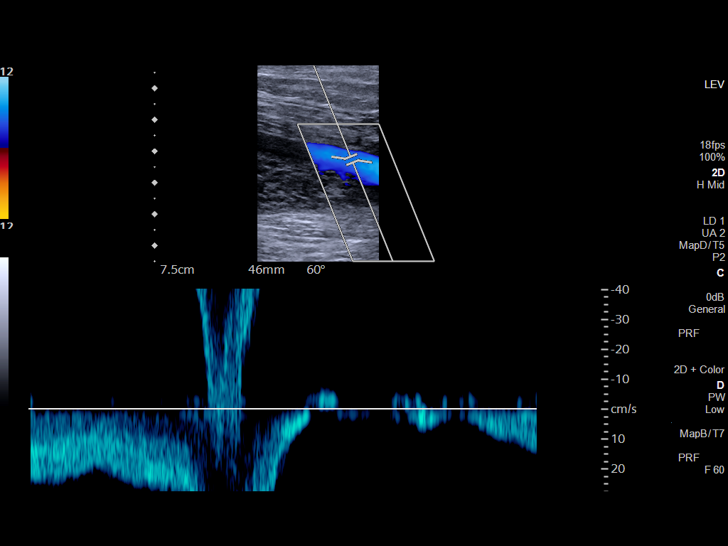
[im 17/56]
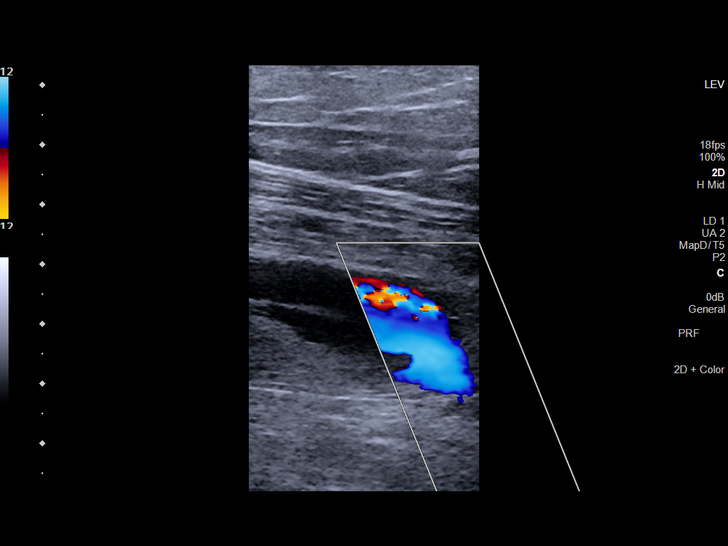
[im 22/56]
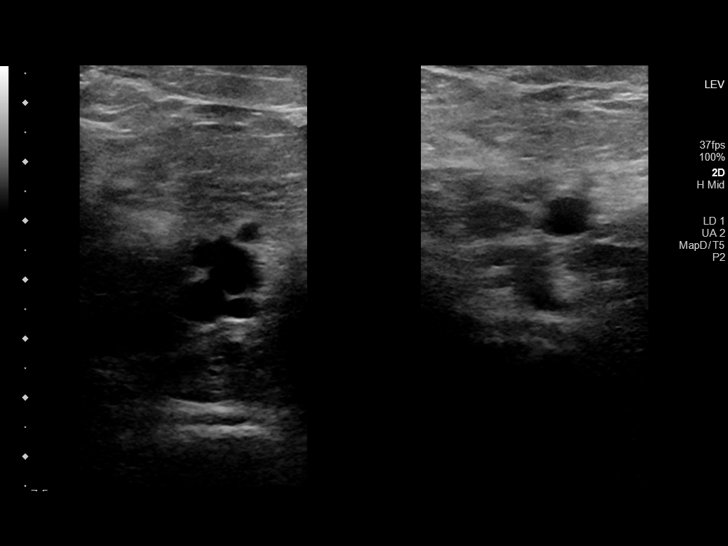
[im 27/56]
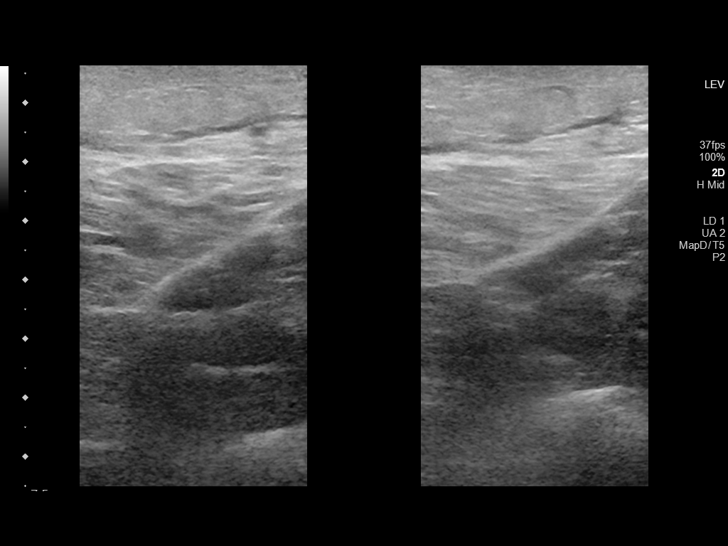
[im 29/56]
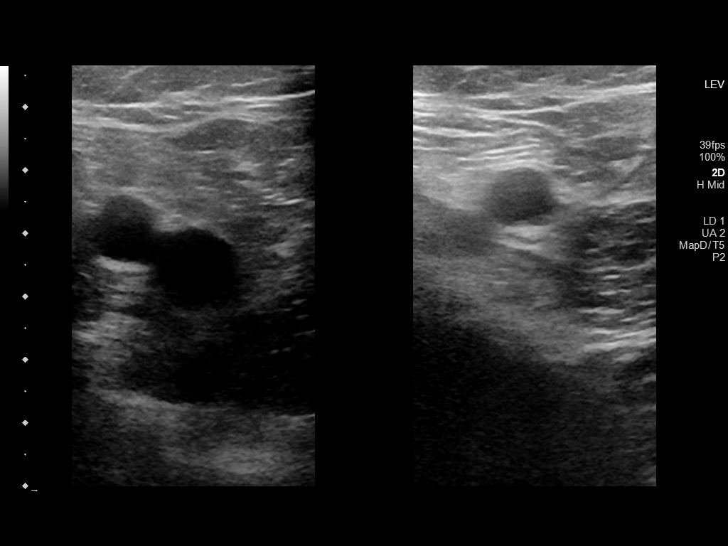
[im 34/56]
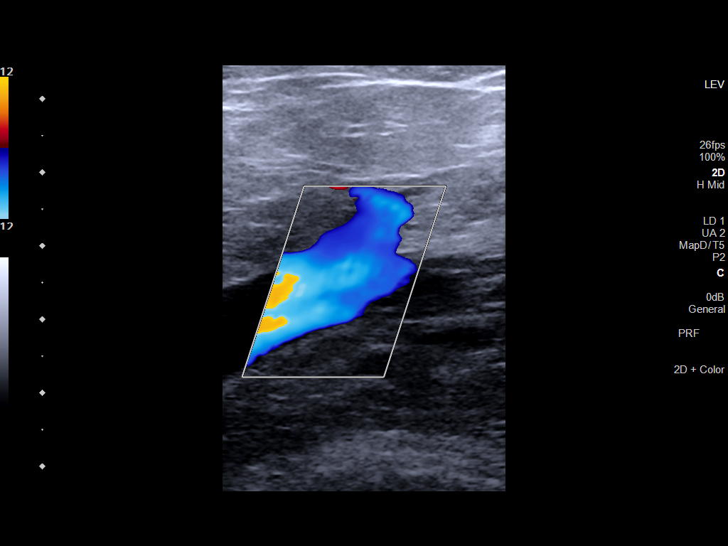
[im 39/56]
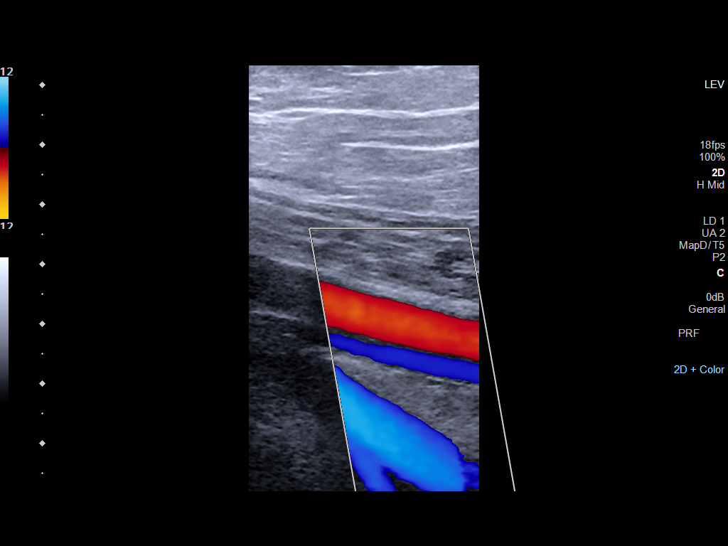
[im 44/56]
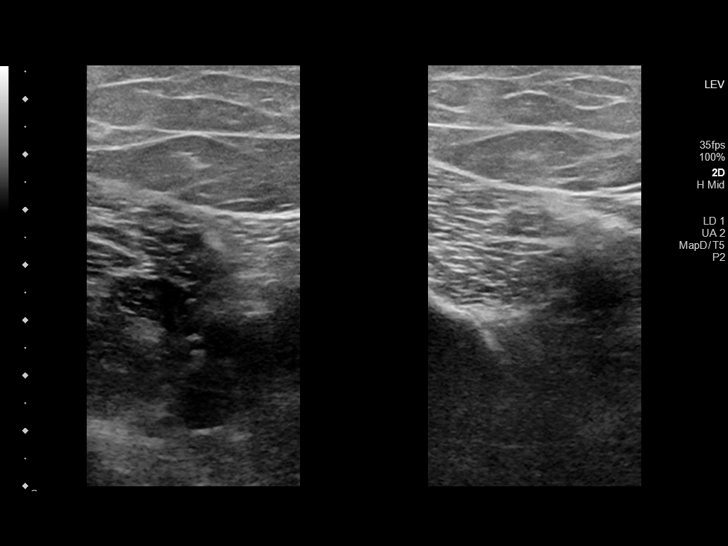
[im 46/56]
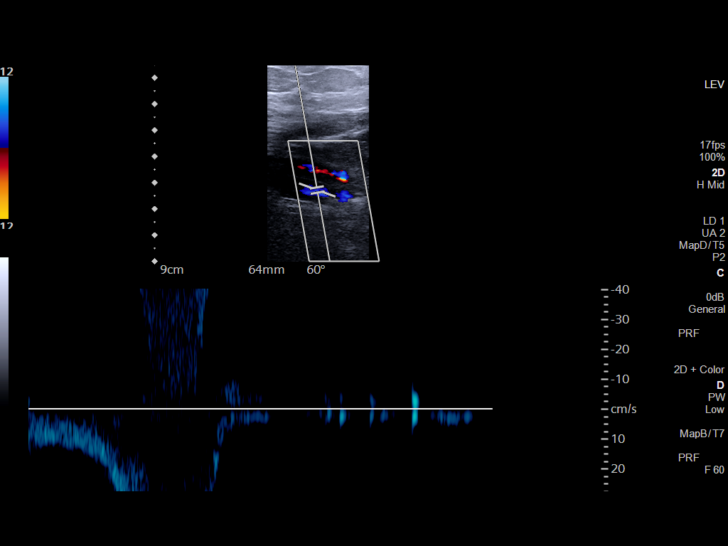
[im 51/56]
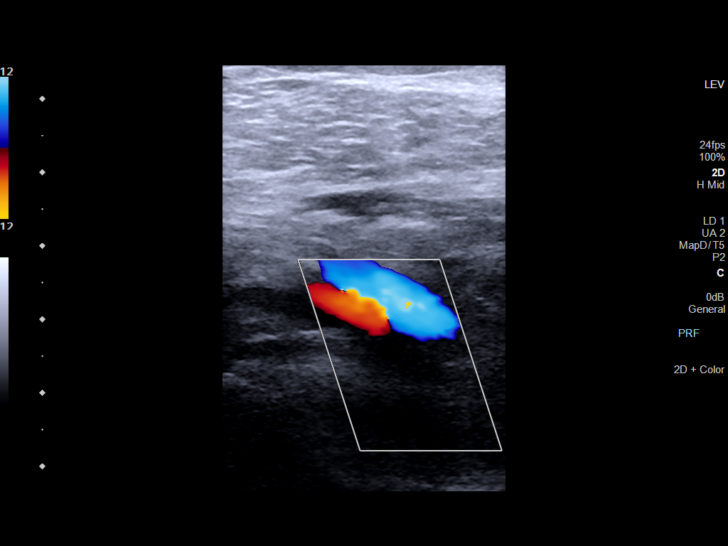
[im 56/56]
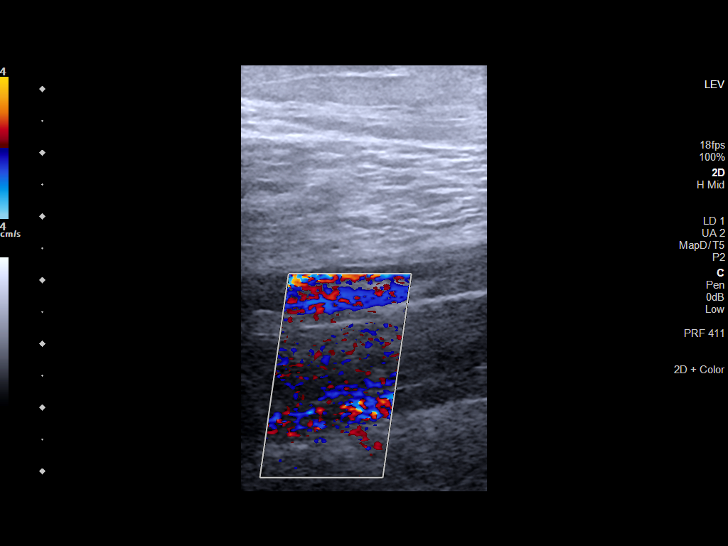

[14 of 24 positions shown; findings below may reference images not displayed]

FINDINGS: VENOUS

Normal compressibility of the common femoral, superficial femoral,
and popliteal veins, as well as the visualized calf veins.
Visualized portions of profunda femoral vein and great saphenous
vein unremarkable. No filling defects to suggest DVT on grayscale or
color Doppler imaging. Doppler waveforms show normal direction of
venous flow, normal respiratory plasticity and response to
augmentation.

OTHER

None.

Limitations: none
IMPRESSION: No evidence of bilateral lower extremity DVT.
